# Patient Record
Sex: Female | Born: 1995
Health system: Southern US, Community
[De-identification: ages and names within clinical notes are randomized; demographics above are authoritative.]

## PROBLEM LIST (undated history)

## (undated) ENCOUNTER — Inpatient Hospital Stay (HOSPITAL_COMMUNITY): Payer: Self-pay

## (undated) DIAGNOSIS — Z789 Other specified health status: Secondary | ICD-10-CM

## (undated) DIAGNOSIS — IMO0001 Reserved for inherently not codable concepts without codable children: Secondary | ICD-10-CM

## (undated) HISTORY — DX: Other specified health status: Z78.9

---
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
## 2014-02-16 ENCOUNTER — Emergency Department (HOSPITAL_COMMUNITY)
Admission: EM | Admit: 2014-02-16 | Discharge: 2014-02-16 | Disposition: A | Discharge status: Home / Self Care | Payer: Medicaid Other | Attending: Emergency Medicine | Admitting: Emergency Medicine

## 2014-02-16 ENCOUNTER — Encounter (HOSPITAL_COMMUNITY): Payer: Self-pay | Admitting: Emergency Medicine

## 2014-02-16 DIAGNOSIS — J029 Acute pharyngitis, unspecified: Secondary | ICD-10-CM

## 2014-02-16 DIAGNOSIS — K0889 Other specified disorders of teeth and supporting structures: Secondary | ICD-10-CM

## 2014-02-16 DIAGNOSIS — K08109 Complete loss of teeth, unspecified cause, unspecified class: Secondary | ICD-10-CM | POA: Insufficient documentation

## 2014-02-16 DIAGNOSIS — K089 Disorder of teeth and supporting structures, unspecified: Secondary | ICD-10-CM | POA: Insufficient documentation

## 2014-02-16 LAB — RAPID STREP SCREEN (MED CTR MEBANE ONLY): STREPTOCOCCUS, GROUP A SCREEN (DIRECT): NEGATIVE

## 2014-02-16 MED ORDER — IBUPROFEN 800 MG PO TABS: 800.0000 mg | ORAL_TABLET | Freq: Three times a day (TID) | ORAL | Status: DC

## 2014-02-16 NOTE — ED Provider Notes (Signed)
CSN: [id number]     Arrival date & time 02/16/14  0940 History  This chart was scribed for Phyllis GladHeather Juan Olthoff, PA-C  working with Juliet RudeNathan R. Rubin PayorPickering, MD by Phyllis Lopez, ED scribe. This patient was seen in room TR07C/TR07C and the patient's care was started at 9:56 AM.     First MD Initiated Contact with Patient 02/16/14 [phone]     No chief complaint on file.    (Consider location/radiation/quality/duration/timing/severity/associated sxs/prior Treatment) The history is provided by the patient. The history is limited by a language barrier. A language interpreter was used.   HPI Comments: Phyllis Lopez is a 18 y.o. female who presents to the Emergency Department complaining of sore throat and subjective fever, onset yesterday. Pt also complains of lower left dental pain. She had a dental extraction two days ago. Pt mentions having pain near the site of the extraction.  The pain is gradually worsening. She has pain with swallowing and chewing. Denies sick contact. Pt is taking Hydrocodone for pain with mild relief. She thinks that she may also be taking an antibiotic prescribed by the oral surgeon.  Pt has an dentist appointment 5/16. Denies rhinorrhea.  Denies cough, congestion, difficulty swallowing, or SOB.    A translator was used. Pt's dentist is [facility]ist[facility] Phyllis Lopez.   History reviewed. No pertinent past medical history. Past Surgical History  Procedure Laterality Date  . Eye surgery     No family history on file. History  Substance Use Topics  . Smoking status: Never Smoker   . Smokeless tobacco: Not on file  . Alcohol Use: No   OB History   Grav Para Term Preterm Abortions TAB SAB Ect Mult Living                 Review of Systems  Constitutional: Positive for fever (subjective fever).  HENT: Positive for dental problem and sore throat. Negative for rhinorrhea.   Respiratory: Negative for cough and shortness of breath.       Allergies  Review of patient's allergies indicates  no known allergies.  Home Medications   Prior to Admission medications   Not on File   BP 102/60  Pulse 75  Temp(Src) 98.1 F (36.7 C) (Oral)  Resp 16  SpO2 100%  LMP 02/07/2014 Physical Exam  Nursing note and vitals reviewed. Constitutional: She appears well-developed and well-nourished.  HENT:  Head: Normocephalic and atraumatic.  Right Ear: Tympanic membrane and ear canal normal.  Left Ear: Tympanic membrane and ear canal normal.  Nose: Nose normal.  Mouth/Throat: Uvula is midline and oropharynx is clear and moist. No trismus in the jaw. No dental abscesses or uvula swelling. No oropharyngeal exudate, posterior oropharyngeal edema or posterior oropharyngeal erythema.      Tenderness to palpation of the left lower posterior gingiva.  No drooling.  No difficulty swallowing.  Normal voice phonation.     Neck: Normal range of motion. Neck supple.  Cardiovascular: Normal rate, regular rhythm and normal heart sounds.  Exam reveals no gallop and no friction rub.   No murmur heard. Pulmonary/Chest: Effort normal and breath sounds normal. No respiratory distress. She has no wheezes. She has no rales.  Abdominal: Soft. She exhibits no distension and no mass. There is no tenderness. There is no rebound and no guarding.  Lymphadenopathy:    She has cervical adenopathy (mild).  Neurological: She is alert.  Skin: Skin is warm and dry. No rash noted.  Psychiatric: She has a normal mood and affect.  ED Course  Procedures (including critical care time) DIAGNOSTIC STUDIES: Oxygen Saturation is 100% on room air, normal by my interpretation.    COORDINATION OF CARE:  10:04 AM Discussed course of care with pt which includes Rapid strep screen.  Advised pt to continue taking antibiotics. Pt understands and agrees.   Labs Review Labs Reviewed - No data to display  Imaging Review No results found.   EKG Interpretation None      MDM   Final diagnoses:  None    Patient  presenting with sore throat and dental pain.  Recent dental extraction two days ago.  Rapid strep negative.  No signs of dental infection at the time.  Patient able to swallow without difficulty.  Patient stable for discharge.  Return precautions given.    Phyllis GladHeather Mayley Lish, PA-C 02/19/14 2252

## 2014-02-16 NOTE — ED Notes (Signed)
Norva KarvonenH Laisure, PA, in w/pt and father - communicating via interpretor.

## 2014-02-16 NOTE — ED Notes (Signed)
She states she has had a sore throat since yesterday.

## 2014-02-18 LAB — CULTURE, GROUP A STREP

## 2014-02-21 NOTE — ED Provider Notes (Signed)
Medical screening examination/treatment/procedure(s) were performed by non-physician practitioner and as supervising physician I was immediately available for consultation/collaboration.   EKG Interpretation None       Eydan Chianese R. Leandre Wien, MD 02/21/14 0815 

## 2014-09-23 ENCOUNTER — Ambulatory Visit: Payer: Medicaid Other

## 2014-10-18 NOTE — L&D Delivery Note (Signed)
Delivery Note 19 y.o. G1P0 at 1662w1d who was admitted for IOL secondary to postdates.  At 2:11 AM a viable female was delivered via Vaginal, Spontaneous Delivery (Presentation: Right Occiput Anterior).  APGAR: 9, 9; weight  .   Placenta status: Intact, Spontaneous.  Cord: 3 vessels with the following complications: None.    Anesthesia: Epidural  Episiotomy: None Lacerations: 2nd degree;Perineal Suture Repair: 3.0 vicryl Est. Blood Loss (mL):  300 Mom to postpartum.  Baby to Couplet care / Skin to Skin.  Tameyah Koch A, MD 09/21/2015, 2:21 AM

## 2014-11-18 ENCOUNTER — Encounter: Payer: Self-pay | Admitting: Medical

## 2014-11-18 ENCOUNTER — Ambulatory Visit (INDEPENDENT_AMBULATORY_CARE_PROVIDER_SITE_OTHER): Payer: 59 | Admitting: Medical

## 2014-11-18 VITALS — BP 100/70 | HR 76 | Temp 97.6°F | Resp 16 | Ht 61.2 in | Wt 120.0 lb

## 2014-11-18 DIAGNOSIS — N926 Irregular menstruation, unspecified: Secondary | ICD-10-CM

## 2014-11-18 DIAGNOSIS — J029 Acute pharyngitis, unspecified: Secondary | ICD-10-CM

## 2014-11-18 MED ORDER — MEDROXYPROGESTERONE ACETATE 10 MG PO TABS: ORAL_TABLET | ORAL | Status: DC

## 2014-11-18 NOTE — Progress Notes (Signed)
Subjective: Here as a new patient, accompanied by Phyllis Lopez her friend and soon to be sister in law who helps translate.  Is from [place] originally.  Speak limited English.  Has concern to stop her period temporarily next month.   Her period is set to start 12/07/14.   Her periods are regular, not too heavy, and she is getting married 12/07/2014, the day her period is set to start.  Per her culture, if a woman is menstruating, she has to postpone the wedding for when she is not menstruating to consummate the marriage.   She would like to take a medication to temporarily stop her period.   They have already sent out wedding invitations for the 2 day ceremony.    She otherwise has been in usual state of health, no significant health issues.  No prior sexual activity.  No prior birth control.  No prior medications to delay period.    No hx/o DVT, no hx/o PE, non smoker.   LMP 11/06/14.   No other aggravating or relieving factors.  Gets sore throat 1-2 times per year, feels fine currently.  No hx/o strep, no GERD ,heartburn, eats healthy. No other complaint.  No significant medical history in self or family  ROS as in subjective   Objective: BP 100/70 mmHg  Pulse 76  Temp(Src) 97.6 F (36.4 C) (Oral)  Resp 16  Ht 5' 1.2" (1.554 m)  Wt 120 lb (54.432 kg)  BMI 22.54 kg/m2  LMP 11/06/2014  General appearance: alert, no distress, WD/WN,[place] female HEENT: normocephalic, sclerae anicteric, TMs pearly, nares patent, no discharge or erythema, pharynx normal Oral cavity: MMM, no lesions Neck: supple, no lymphadenopathy, no thyromegaly, no masses Heart: RRR, normal S1, S2, no murmurs Lungs: CTA bilaterally, no wheezes, rhonchi, or rales Abdomen: +bs, soft, non tender, non distended, no masses, no hepatomegaly, no splenomegaly Pulses: 2+ symmetric, upper and lower extremities, normal cap refill Gyn: not examined, patient declines and no prior sexual activity   Assessment: Encounter Diagnosis  Name  Primary?  . Menstrual changes Yes     Plan: She appears to be a healthy [place] female.   We discussed her desire to postpone her period for her wedding coming up on 11/2014 the day her period should be starting.  discussed possible medications that could help this.  Advised there are no guarantees 100% that medications will work.   Gave options of hormonal medications.   Discussed risks of the medications, discussed the fact that these medications are hormones and used normally for contraception even though that is not her intended purpose.  Provera sent to pharmacy as below.  Advised she return for a physical at her convenience.

## 2015-03-03 ENCOUNTER — Other Ambulatory Visit (HOSPITAL_COMMUNITY): Payer: Self-pay | Admitting: Urology

## 2015-03-03 DIAGNOSIS — Z3682 Encounter for antenatal screening for nuchal translucency: Secondary | ICD-10-CM

## 2015-03-03 LAB — OB RESULTS CONSOLE RUBELLA ANTIBODY, IGM: RUBELLA: IMMUNE

## 2015-03-03 LAB — OB RESULTS CONSOLE GC/CHLAMYDIA
CHLAMYDIA, DNA PROBE: NEGATIVE
Gonorrhea: NEGATIVE

## 2015-03-03 LAB — OB RESULTS CONSOLE VARICELLA ZOSTER ANTIBODY, IGG: Varicella: IMMUNE

## 2015-03-03 LAB — OB RESULTS CONSOLE RPR: RPR: NONREACTIVE

## 2015-03-03 LAB — OB RESULTS CONSOLE ANTIBODY SCREEN: ANTIBODY SCREEN: NEGATIVE

## 2015-03-03 LAB — OB RESULTS CONSOLE ABO/RH: RH TYPE: POSITIVE

## 2015-03-03 LAB — OB RESULTS CONSOLE HIV ANTIBODY (ROUTINE TESTING): HIV: NONREACTIVE

## 2015-03-03 LAB — OB RESULTS CONSOLE HEPATITIS B SURFACE ANTIGEN: Hepatitis B Surface Ag: NEGATIVE

## 2015-03-07 ENCOUNTER — Encounter (HOSPITAL_COMMUNITY): Payer: Self-pay

## 2015-03-07 ENCOUNTER — Ambulatory Visit (HOSPITAL_COMMUNITY)
Admission: RE | Admit: 2015-03-07 | Discharge: 2015-03-07 | Disposition: A | Discharge status: Home / Self Care | Payer: Medicaid Other | Source: Ambulatory Visit | Attending: Pediatrics | Admitting: Pediatrics

## 2015-03-07 DIAGNOSIS — Z3A12 12 weeks gestation of pregnancy: Secondary | ICD-10-CM | POA: Insufficient documentation

## 2015-03-07 DIAGNOSIS — Z3682 Encounter for antenatal screening for nuchal translucency: Secondary | ICD-10-CM

## 2015-03-07 DIAGNOSIS — Z36 Encounter for antenatal screening of mother: Secondary | ICD-10-CM | POA: Insufficient documentation

## 2015-03-12 ENCOUNTER — Other Ambulatory Visit (HOSPITAL_COMMUNITY): Payer: Self-pay | Admitting: Urology

## 2015-04-22 ENCOUNTER — Ambulatory Visit (HOSPITAL_COMMUNITY)
Admission: RE | Admit: 2015-04-22 | Discharge: 2015-04-22 | Disposition: A | Discharge status: Home / Self Care | Payer: Medicaid Other | Source: Ambulatory Visit | Attending: Physician Assistant | Admitting: Physician Assistant

## 2015-04-22 ENCOUNTER — Other Ambulatory Visit (HOSPITAL_COMMUNITY): Payer: Self-pay | Admitting: Urology

## 2015-04-22 DIAGNOSIS — Z0489 Encounter for examination and observation for other specified reasons: Secondary | ICD-10-CM

## 2015-04-22 DIAGNOSIS — IMO0002 Reserved for concepts with insufficient information to code with codable children: Secondary | ICD-10-CM

## 2015-04-22 DIAGNOSIS — Z3A19 19 weeks gestation of pregnancy: Secondary | ICD-10-CM | POA: Insufficient documentation

## 2015-04-22 DIAGNOSIS — Z3689 Encounter for other specified antenatal screening: Secondary | ICD-10-CM | POA: Insufficient documentation

## 2015-04-22 DIAGNOSIS — Z36 Encounter for antenatal screening of mother: Secondary | ICD-10-CM | POA: Insufficient documentation

## 2015-06-06 ENCOUNTER — Inpatient Hospital Stay (HOSPITAL_COMMUNITY)
Admission: AD | Admit: 2015-06-06 | Discharge: 2015-06-06 | Disposition: A | Discharge status: Home / Self Care | Payer: Medicaid Other | Source: Ambulatory Visit | Attending: Family Medicine | Admitting: Family Medicine

## 2015-06-06 ENCOUNTER — Encounter (HOSPITAL_COMMUNITY): Payer: Self-pay | Admitting: *Deleted

## 2015-06-06 DIAGNOSIS — R05 Cough: Secondary | ICD-10-CM | POA: Insufficient documentation

## 2015-06-06 DIAGNOSIS — Z758 Other problems related to medical facilities and other health care: Secondary | ICD-10-CM

## 2015-06-06 DIAGNOSIS — Z3A25 25 weeks gestation of pregnancy: Secondary | ICD-10-CM | POA: Diagnosis not present

## 2015-06-06 DIAGNOSIS — R079 Chest pain, unspecified: Secondary | ICD-10-CM | POA: Diagnosis not present

## 2015-06-06 DIAGNOSIS — Z789 Other specified health status: Secondary | ICD-10-CM

## 2015-06-06 DIAGNOSIS — J029 Acute pharyngitis, unspecified: Secondary | ICD-10-CM | POA: Insufficient documentation

## 2015-06-06 DIAGNOSIS — J069 Acute upper respiratory infection, unspecified: Secondary | ICD-10-CM

## 2015-06-06 DIAGNOSIS — O9989 Other specified diseases and conditions complicating pregnancy, childbirth and the puerperium: Secondary | ICD-10-CM | POA: Diagnosis not present

## 2015-06-06 DIAGNOSIS — B9789 Other viral agents as the cause of diseases classified elsewhere: Secondary | ICD-10-CM

## 2015-06-06 HISTORY — DX: Reserved for inherently not codable concepts without codable children: IMO0001

## 2015-06-06 NOTE — Discharge Instructions (Signed)
FOR YOUR COUGH:  -  Robitussin DM (with guaifenesin and dextromethorphan)-- SHOW this to the pharmacist if you are unsure of what to buy.  -  Drink lots of water - Try drinking tea with honey and ginger -  Get lots of sleep  Return to care if you have - High fevers >101.5 - Cough that is productive with green or brown sputum - Nausea/vomitting - trouble breathing  - Any worsening symptoms  You can call the [facility] Department for an appointment if you are feeling worse.   Upper Respiratory Infection, Adult An upper respiratory infection (URI) is also sometimes known as the common cold. The upper respiratory tract includes the nose, sinuses, throat, trachea, and bronchi. Bronchi are the airways leading to the lungs. Most people improve within 1 week, but symptoms can last up to 2 weeks. A residual cough may last even longer.  CAUSES Many different viruses can infect the tissues lining the upper respiratory tract. The tissues become irritated and inflamed and often become very moist. Mucus production is also common. A cold is contagious. You can easily spread the virus to others by oral contact. This includes kissing, sharing a glass, coughing, or sneezing. Touching your mouth or nose and then touching a surface, which is then touched by another person, can also spread the virus.  Cough, Adult  A cough is a reflex. It helps you clear your throat and airways. A cough can help heal your body. A cough can last 2 or 3 weeks (acute) or may last more than 8 weeks (chronic). Some common causes of a cough can include an infection, allergy, or a cold. HOME CARE  Only take medicine as told by your doctor.  If given, take your medicines (antibiotics) as told. Finish them even if you start to feel better.  Use a cold steam vaporizer or humidifier in your home. This can help loosen thick spit (secretions).  Sleep so you are almost sitting up (semi-upright). Use pillows to do this. This  helps reduce coughing.  Rest as needed.  Stop smoking if you smoke. GET HELP RIGHT AWAY IF:  You have yellowish-white fluid (pus) in your thick spit.  Your cough gets worse.  Your medicine does not reduce coughing, and you are losing sleep.  You cough up blood.  You have trouble breathing.  Your pain gets worse and medicine does not help.  You have a fever. MAKE SURE YOU:   Understand these instructions.  Will watch your condition.  Will get help right away if you are not doing well or get worse. Document Released: 06/17/2011 Document Revised: 02/18/2014 Document Reviewed: 06/17/2011 [facility] Patient Information 2015 [place], [place]. This information is not intended to replace advice given to you by your health care provider. Make sure you discuss any questions you have with your health care provider.  SYMPTOMS  Symptoms typically develop 1 to 3 days after you come in contact with a cold virus. Symptoms vary from person to person. They may include:  Runny nose.  Sneezing.  Nasal congestion.  Sinus irritation.  Sore throat.  Loss of voice (laryngitis).  Cough.  Fatigue.  Muscle aches.  Loss of appetite.  Headache.  Low-grade fever. DIAGNOSIS  You might diagnose your own cold based on familiar symptoms, since most people get a cold 2 to 3 times a year. Your caregiver can confirm this based on your exam. Most importantly, your caregiver can check that your symptoms are not due to another  disease such as strep throat, sinusitis, pneumonia, asthma, or epiglottitis. Blood tests, throat tests, and X-rays are not necessary to diagnose a common cold, but they may sometimes be helpful in excluding other more serious diseases. Your caregiver will decide if any further tests are required. RISKS AND COMPLICATIONS  You may be at risk for a more severe case of the common cold if you smoke cigarettes, have chronic heart disease (such as heart failure) or lung disease (such  as asthma), or if you have a weakened immune system. The very young and very old are also at risk for more serious infections. Bacterial sinusitis, middle ear infections, and bacterial pneumonia can complicate the common cold. The common cold can worsen asthma and chronic obstructive pulmonary disease (COPD). Sometimes, these complications can require emergency medical care and may be life-threatening. PREVENTION  The best way to protect against getting a cold is to practice good hygiene. Avoid oral or hand contact with people with cold symptoms. Wash your hands often if contact occurs. There is no clear evidence that vitamin C, vitamin E, echinacea, or exercise reduces the chance of developing a cold. However, it is always recommended to get plenty of rest and practice good nutrition. TREATMENT  Treatment is directed at relieving symptoms. There is no cure. Antibiotics are not effective, because the infection is caused by a virus, not by bacteria. Treatment may include:  Increased fluid intake. Sports drinks offer valuable electrolytes, sugars, and fluids.  Breathing heated mist or steam (vaporizer or shower).  Eating chicken soup or other clear broths, and maintaining good nutrition.  Getting plenty of rest.  Using gargles or lozenges for comfort.  Controlling fevers with ibuprofen or acetaminophen as directed by your caregiver.  Increasing usage of your inhaler if you have asthma. Zinc gel and zinc lozenges, taken in the first 24 hours of the common cold, can shorten the duration and lessen the severity of symptoms. Pain medicines may help with fever, muscle aches, and throat pain. A variety of non-prescription medicines are available to treat congestion and runny nose. Your caregiver can make recommendations and may suggest nasal or lung inhalers for other symptoms.  HOME CARE INSTRUCTIONS   Only take over-the-counter or prescription medicines for pain, discomfort, or fever as directed by  your caregiver.  Use a warm mist humidifier or inhale steam from a shower to increase air moisture. This may keep secretions moist and make it easier to breathe.  Drink enough water and fluids to keep your urine clear or pale yellow.  Rest as needed.  Return to work when your temperature has returned to normal or as your caregiver advises. You may need to stay home longer to avoid infecting others. You can also use a face mask and careful hand washing to prevent spread of the virus. SEEK MEDICAL CARE IF:   After the first few days, you feel you are getting worse rather than better.  You need your caregiver's advice about medicines to control symptoms.  You develop chills, worsening shortness of breath, or brown or red sputum. These may be signs of pneumonia.  You develop yellow or brown nasal discharge or pain in the face, especially when you bend forward. These may be signs of sinusitis.  You develop a fever, swollen neck glands, pain with swallowing, or white areas in the back of your throat. These may be signs of strep throat. SEEK IMMEDIATE MEDICAL CARE IF:   You have a fever.  You develop severe or persistent  headache, ear pain, sinus pain, or chest pain.  You develop wheezing, a prolonged cough, cough up blood, or have a change in your usual mucus (if you have chronic lung disease).  You develop sore muscles or a stiff neck. Document Released: 03/30/2001 Document Revised: 12/27/2011 Document Reviewed: 01/09/2014 [facility] Patient Information 2015 [place], [place]. This information is not intended to replace advice given to you by your health care provider. Make sure you discuss any questions you have with your health care provider.

## 2015-06-06 NOTE — MAU Note (Signed)
Patient presents at [redacted] weeks gestation with c/o chest pain, cough, and sore throat since yesterday. States the fetus has been active but not today. Denies bleeding or discharge.

## 2015-06-06 NOTE — MAU Provider Note (Signed)
History     CSN: [id number]  Arrival date and time: 06/06/15 1335  Chief Complaint  Patient presents with  . Chest Pain  . Cough  . Sore Throat   HPI Patient is 19 y.o. G1P0 at [redacted]w[redacted]d here with complaints of sore throat, cough, and chest pain  Sore throat and chest pain. Reports cough. The throat pain is constant and chest pain is when she is coughing.  CP is located in the middle of her chest. Rates throat pain as 10/10 with coughing and 4-5/10 when not coughing.  Rates chest pain as 10/10 with coughing and is 0/10 without coughing.   Coughing aggravates sore throat and chest pain. Coughing is worse with lying back. Reports sneezing. No fevers, chills. Reports SOB and trouble breathing when she is coughing. The coughing is worsening.  Has not tried medications. Used to take a OTC for throat pain prior to pregnancy.    Travel- Denies recent travel in [place] or abroad Sick contacts- denies  Exposure to young children- denies Denies nasal congestion and itchy or watery eyes.  Denies smoking  +FM, denies LOF, VB, contractions, vaginal discharge.  OB History    Gravida Para Term Preterm AB TAB SAB Ectopic Multiple Living   1 0        0      Past Medical History  Diagnosis Date  . Medical history non-contributory     Past Surgical History  Procedure Laterality Date  . Eye surgery      History reviewed. No pertinent family history.  Social History  Substance Use Topics  . Smoking status: Never Smoker   . Smokeless tobacco: None  . Alcohol Use: No    Allergies: No Known Allergies  Prescriptions prior to admission  Medication Sig Dispense Refill Last Dose  . Prenatal Vit-Fe Fumarate-FA (PRENATAL VITAMIN PO) Take 1 tablet by mouth daily.    06/06/2015 at Unknown time  . medroxyPROGESTERone (PROVERA) 10 MG tablet Take 1 tablet daily at same exact time every day.  Begin 7 days prior to onset of menses, and don't stop until about 3-4 days after wedding (Patient not taking:  Reported on 03/07/2015) 20 tablet 0 Not Taking    Review of Systems  Constitutional: Negative for fever and chills.  HENT: Positive for sore throat. Negative for congestion.   Eyes: Negative for blurred vision and double vision.  Respiratory: Positive for cough. Negative for shortness of breath.   Cardiovascular: Negative for chest pain and orthopnea.  Gastrointestinal: Negative for nausea and vomiting.  Genitourinary: Negative for frequency.  Musculoskeletal: Negative for myalgias.  Skin: Negative for rash.  Neurological: Negative for dizziness, tingling, weakness and headaches.  Endo/Heme/Allergies: Does not bruise/bleed easily.  Psychiatric/Behavioral: Negative for depression.     Physical Exam   Blood pressure 121/65, pulse 99, temperature 98.2 F (36.8 C), temperature source Oral, resp. rate 20, height  (1.575 m), weight 133 lb (60.328 kg), last menstrual period 12/07/2014.  Physical Exam  Constitutional: She is oriented to person, place, and time. She appears well-developed and well-nourished. No distress.  Pregnant female  HENT:  Head: Normocephalic and atraumatic.  Mouth/Throat: Oropharynx is clear and moist. No oropharyngeal exudate.  Mildly erythematous posterior oral pharynx.   Eyes: Conjunctivae are normal. No scleral icterus.  Neck: Normal range of motion. Neck supple.  Cardiovascular: Normal rate and intact distal pulses.   Respiratory: Effort normal. No respiratory distress. She has no wheezes. She has no rales. She exhibits no tenderness.  Normal WOB, All lung fields are clear to ascultation  GI: Soft. There is no tenderness.  Gravid  Genitourinary: Vagina normal.  Musculoskeletal: Normal range of motion. She exhibits no edema.  Lymphadenopathy:    She has cervical adenopathy (shotty LAD in bilateral cervical chain and submandidular).  Neurological: She is alert and oriented to person, place, and time.  Skin: Skin is warm and dry. No rash noted.   Psychiatric: She has a normal mood and affect.    MAU Course  Procedures MDM No need for labs given presentation No need for radiological studies as patient has no focal findings.  NST 135/mod/+accels (10X10) no decels Toco: quiet  Assessment and Plan  Margot Oriordan is is 19 y.o. G1P0 at [redacted]w[redacted]d presenting with cough, sore throat and chest pain likely 2/2 to viral URI given onset of sx, time course and lack of physical exam finding. Unlikely bacterial PNA given no fevers, rigors,focal findings on lung exam. Unlikely strep pharyngitis with Centor criteria of 0.  - Recommended conservative management with OTC cough suppressants  - Discussed use of tea with ginger and honey for cough - Recommended hydration - Reviewed return precautions for PNA  Isa Rankin Kelaiah Escalona 06/06/2015, 3:32 PM

## 2015-08-20 LAB — OB RESULTS CONSOLE GC/CHLAMYDIA
Chlamydia: NEGATIVE
GC PROBE AMP, GENITAL: NEGATIVE

## 2015-08-20 LAB — OB RESULTS CONSOLE GBS: STREP GROUP B AG: NEGATIVE

## 2015-09-16 ENCOUNTER — Telehealth (HOSPITAL_COMMUNITY): Payer: Self-pay | Admitting: *Deleted

## 2015-09-16 NOTE — Telephone Encounter (Signed)
Preadmission screen  

## 2015-09-16 NOTE — Telephone Encounter (Signed)
Interpreter number [phone]

## 2015-09-20 ENCOUNTER — Encounter (HOSPITAL_COMMUNITY): Payer: Self-pay

## 2015-09-20 ENCOUNTER — Inpatient Hospital Stay (HOSPITAL_COMMUNITY)
Admission: RE | Admit: 2015-09-20 | Discharge: 2015-09-22 | DRG: 775 | Disposition: A | Discharge status: Home / Self Care | Payer: Medicaid Other | Source: Ambulatory Visit | Attending: Obstetrics & Gynecology | Admitting: Obstetrics & Gynecology

## 2015-09-20 ENCOUNTER — Inpatient Hospital Stay (HOSPITAL_COMMUNITY): Payer: Medicaid Other | Admitting: Anesthesiology

## 2015-09-20 VITALS — BP 105/50 | HR 78 | Temp 98.5°F | Resp 18 | Ht 62.0 in | Wt 154.3 lb

## 2015-09-20 DIAGNOSIS — Z3A41 41 weeks gestation of pregnancy: Secondary | ICD-10-CM

## 2015-09-20 DIAGNOSIS — Z8249 Family history of ischemic heart disease and other diseases of the circulatory system: Secondary | ICD-10-CM | POA: Diagnosis not present

## 2015-09-20 DIAGNOSIS — O480 Post-term pregnancy: Principal | ICD-10-CM | POA: Diagnosis not present

## 2015-09-20 DIAGNOSIS — Z789 Other specified health status: Secondary | ICD-10-CM | POA: Diagnosis present

## 2015-09-20 DIAGNOSIS — Z3401 Encounter for supervision of normal first pregnancy, first trimester: Secondary | ICD-10-CM

## 2015-09-20 DIAGNOSIS — Z833 Family history of diabetes mellitus: Secondary | ICD-10-CM

## 2015-09-20 DIAGNOSIS — IMO0001 Reserved for inherently not codable concepts without codable children: Secondary | ICD-10-CM

## 2015-09-20 LAB — CBC
HCT: 36.4 % (ref 36.0–46.0)
Hemoglobin: 12.2 g/dL (ref 12.0–15.0)
MCH: 28.9 pg (ref 26.0–34.0)
MCHC: 33.5 g/dL (ref 30.0–36.0)
MCV: 86.3 fL (ref 78.0–100.0)
PLATELETS: 153 10*3/uL (ref 150–400)
RBC: 4.22 MIL/uL (ref 3.87–5.11)
RDW: 15 % (ref 11.5–15.5)
WBC: 14.7 10*3/uL — AB (ref 4.0–10.5)

## 2015-09-20 LAB — TYPE AND SCREEN
ABO/RH(D): AB POS
Antibody Screen: NEGATIVE

## 2015-09-20 LAB — ABO/RH: ABO/RH(D): AB POS

## 2015-09-20 MED ORDER — MISOPROSTOL 25 MCG QUARTER TABLET
25.0000 ug | ORAL_TABLET | ORAL | Status: DC | PRN
  Administered 2015-09-20: 25 ug via VAGINAL
  Filled 2015-09-20: qty 0.25

## 2015-09-20 MED ORDER — FENTANYL CITRATE (PF) 100 MCG/2ML IJ SOLN: 100.0000 ug | INTRAMUSCULAR | Status: DC | PRN

## 2015-09-20 MED ORDER — OXYTOCIN 40 UNITS IN LACTATED RINGERS INFUSION - SIMPLE MED
1.0000 m[IU]/min | INTRAVENOUS | Status: DC
  Administered 2015-09-20: 1 m[IU]/min via INTRAVENOUS

## 2015-09-20 MED ORDER — TERBUTALINE SULFATE 1 MG/ML IJ SOLN
INTRAMUSCULAR | Status: AC
  Administered 2015-09-20: 0.25 mg via SUBCUTANEOUS
  Filled 2015-09-20: qty 1

## 2015-09-20 MED ORDER — LACTATED RINGERS IV SOLN
INTRAVENOUS | Status: DC
  Administered 2015-09-20 (×3): via INTRAVENOUS

## 2015-09-20 MED ORDER — ONDANSETRON HCL 4 MG/2ML IJ SOLN: 4.0000 mg | Freq: Four times a day (QID) | INTRAMUSCULAR | Status: DC | PRN

## 2015-09-20 MED ORDER — FENTANYL 2.5 MCG/ML BUPIVACAINE 1/10 % EPIDURAL INFUSION (WH - ANES)
14.0000 mL/h | INTRAMUSCULAR | Status: DC | PRN
  Administered 2015-09-20 (×2): 14 mL/h via EPIDURAL
  Filled 2015-09-20: qty 125

## 2015-09-20 MED ORDER — EPHEDRINE 5 MG/ML INJ
10.0000 mg | INTRAVENOUS | Status: DC | PRN
  Filled 2015-09-20: qty 2

## 2015-09-20 MED ORDER — DIPHENHYDRAMINE HCL 50 MG/ML IJ SOLN: 12.5000 mg | INTRAMUSCULAR | Status: DC | PRN

## 2015-09-20 MED ORDER — LIDOCAINE HCL (PF) 1 % IJ SOLN
30.0000 mL | INTRAMUSCULAR | Status: DC | PRN
  Filled 2015-09-20: qty 30

## 2015-09-20 MED ORDER — LACTATED RINGERS IV SOLN
500.0000 mL | INTRAVENOUS | Status: DC | PRN
  Administered 2015-09-20: 500 mL via INTRAVENOUS

## 2015-09-20 MED ORDER — OXYTOCIN 40 UNITS IN LACTATED RINGERS INFUSION - SIMPLE MED
1.0000 m[IU]/min | INTRAVENOUS | Status: DC
  Administered 2015-09-20: 2 m[IU]/min via INTRAVENOUS
  Filled 2015-09-20: qty 1000

## 2015-09-20 MED ORDER — OXYCODONE-ACETAMINOPHEN 5-325 MG PO TABS: 1.0000 | ORAL_TABLET | ORAL | Status: DC | PRN

## 2015-09-20 MED ORDER — OXYTOCIN 40 UNITS IN LACTATED RINGERS INFUSION - SIMPLE MED: 62.5000 mL/h | INTRAVENOUS | Status: DC

## 2015-09-20 MED ORDER — OXYTOCIN BOLUS FROM INFUSION: 500.0000 mL | INTRAVENOUS | Status: DC

## 2015-09-20 MED ORDER — CITRIC ACID-SODIUM CITRATE 334-500 MG/5ML PO SOLN
30.0000 mL | ORAL | Status: DC | PRN
  Filled 2015-09-20: qty 15

## 2015-09-20 MED ORDER — TERBUTALINE SULFATE 1 MG/ML IJ SOLN
0.2500 mg | Freq: Once | INTRAMUSCULAR | Status: AC | PRN
  Administered 2015-09-20: 0.25 mg via SUBCUTANEOUS

## 2015-09-20 MED ORDER — OXYCODONE-ACETAMINOPHEN 5-325 MG PO TABS: 2.0000 | ORAL_TABLET | ORAL | Status: DC | PRN

## 2015-09-20 MED ORDER — PHENYLEPHRINE 40 MCG/ML (10ML) SYRINGE FOR IV PUSH (FOR BLOOD PRESSURE SUPPORT)
80.0000 ug | PREFILLED_SYRINGE | INTRAVENOUS | Status: DC | PRN
  Filled 2015-09-20: qty 2
  Filled 2015-09-20: qty 20

## 2015-09-20 MED ORDER — ACETAMINOPHEN 325 MG PO TABS: 650.0000 mg | ORAL_TABLET | ORAL | Status: DC | PRN

## 2015-09-20 MED ORDER — LIDOCAINE HCL (PF) 1 % IJ SOLN
INTRAMUSCULAR | Status: DC | PRN
  Administered 2015-09-20: 5 mL
  Administered 2015-09-20: 3 mL
  Administered 2015-09-20: 5 mL

## 2015-09-20 NOTE — Progress Notes (Signed)
Labor Progress Note Eduard RouxBhadri Lopez is a 19 y.o. G1P0 at 3465w0d presented for IOL for postdates  S: patient with prolonged deceleration but now stable. Pitocin was restarted and fetus tolerating well.   O:  BP 121/79 mmHg  Pulse 92  Temp(Src) 98.1 F (36.7 C) (Oral)  Resp 18  Ht 5\' 2"  (1.575 m)  Wt 154 lb 5.2 oz (70 kg)  BMI 28.22 kg/m2  SpO2 94%  LMP 12/07/2014 EFM: 140/mod/+accels, no further decels with excellent beat to beat variability  CVE: Dilation: 6.5 Effacement (%): 70 Station: Ballotable Presentation: Vertex Exam by:: Dr. Alvester MorinNewton   A&P: 19 y.o. G1P0 8865w0d her for PD IOL #Labor: progressing normally. Continue Pitocin, currently at 2mu with regular contractions #Pain: prn epidural or IV meds #FWB: Cat I #GBS neg #Anticipate NSVD  Federico FlakeKimberly Niles Adekunle Rohrbach, MD 6:25 PM

## 2015-09-20 NOTE — H&P (Signed)
OBSTETRIC ADMISSION HISTORY AND PHYSICAL  Phyllis RouxBhadri Lopez is a 19 y.o. female G1P0 with IUP at 4732w0d by L/12  presenting for IOL for postdates. SHe has been having contractions. Reports she was 1cm in clinic She reports +FMs, No LOF, no VB, no blurry vision, headaches or peripheral edema, and RUQ pain.  She plans on bottle feeding. She request Depo for birth control.  Dating: By L/12 --->  Estimated Date of Delivery: 09/13/15  Sono:  @12wk - NT wnl  @[redacted]w[redacted]d   normal anatomy, female  Prenatal History/Complications:  Past Medical History: Past Medical History  Diagnosis Date  . Healthy adult     Past Surgical History: Past Surgical History  Procedure Laterality Date  . Eye surgery  2011    lazy eye    Obstetrical History: OB History    Gravida Para Term Preterm AB TAB SAB Ectopic Multiple Living   1 0        0      Social History: Social History   Social History  . Marital Status: Single    Spouse Name: N/A  . Number of Children: N/A  . Years of Education: N/A   Social History Main Topics  . Smoking status: Never Smoker   . Smokeless tobacco: None  . Alcohol Use: No  . Drug Use: No  . Sexual Activity: Yes    Birth Control/ Protection: None   Other Topics Concern  . None   Social History Narrative    Family History: Family History  Problem Relation Age of Onset  . Diabetes Neg Hx   . Hypertension Neg Hx     Allergies: No Known Allergies  Prescriptions prior to admission  Medication Sig Dispense Refill Last Dose  . Prenatal Vit-Fe Fumarate-FA (PRENATAL VITAMIN PO) Take 1 tablet by mouth daily.    06/06/2015 at Unknown time     Review of Systems   All systems reviewed and negative except as stated in HPI  Blood pressure 115/75, pulse 99, temperature 97.8 F (36.6 C), temperature source Oral, resp. rate 18, height 5\' 2"  (1.575 m), weight 154 lb 5.2 oz (70 kg), last menstrual period 12/07/2014. General appearance: alert, cooperative and appears stated  age Lungs: clear to auscultation bilaterally Heart: regular rate and rhythm Abdomen: soft, non-tender; bowel sounds normal Pelvic: adequate Extremities: Homans sign is negative, no sign of DVT  Presentation: cephalic Fetal monitoringBaseline: 145 bpm, Variability: Good {> 6 bpm), Accelerations: Reactive and Decelerations: Absent Uterine activityFrequency: Every 7-10 minutes Dilation: 2.5 Effacement (%): 50 Exam by:: Dr. Alvester MorinNewton   Prenatal labs: ABO, Rh: AB/Positive/-- (05/16 0000) Antibody: Negative (05/16 0000) Rubella: Immune RPR: Nonreactive (05/16 0000)  HBsAg: Negative (05/16 0000)  HIV: Non-reactive (05/16 0000)  GBS: Negative (11/02 0000)  1 hr Glucola 130, neg Genetic screening  NT negative, Quad neg Anatomy US wnl  Prenatal Transfer Tool  Maternal Diabetes: No Genetic Screening: Normal- NT was wnl  Maternal Ultrasounds/Referrals: Normal Fetal Ultrasounds or other Referrals:  None Maternal Substance Abuse:  No Significant Maternal Medications:  None Significant Maternal Lab Results: Lab values include: Group B Strep negative  Results for orders placed or performed during the hospital encounter of 09/20/15 (from the past 24 hour(s))  CBC   Collection Time: 09/20/15  8:10 AM  Result Value Ref Range   WBC 14.7 (H) 4.0 - 10.5 K/uL   RBC 4.22 3.87 - 5.11 MIL/uL   Hemoglobin 12.2 12.0 - 15.0 g/dL   HCT 16.136.4 09.636.0 - 04.546.0 %  MCV 86.3 78.0 - 100.0 fL   MCH 28.9 26.0 - 34.0 pg   MCHC 33.5 30.0 - 36.0 g/dL   RDW 16.1 09.6 - 04.5 %   Platelets 153 150 - 400 K/uL    Patient Active Problem List   Diagnosis Date Noted  . Post term pregnancy, 41 weeks 09/20/2015  . Supervision of normal first pregnancy 06/06/2015   Assessment: Phyllis Lopez is a 19 y.o. G1P0 at [redacted]w[redacted]d here for PD- IOL  #Labor:Induction with cytotec for further ripening, unlikely that patient will need FB.Plan for pitocin when appropriate #Pain: She will think about an epidural #FWB: Cat I #ID:  GBS  neg #MOF: Bottle/formula #MOC: Depo #Circ:  Declines  Federico Flake 09/20/2015, 8:57 AM

## 2015-09-20 NOTE — Progress Notes (Addendum)
[employer] Interpreter Service used to [place] for pt, Charity fundraiserN and MD.

## 2015-09-20 NOTE — Progress Notes (Addendum)
Warden[employer] Interpreter Service used by phone to [employer] for [employer] .

## 2015-09-20 NOTE — Progress Notes (Signed)
LABOR PROGRESS NOTE  Phyllis Lopez is a 19 y.o. G1P0 at 6258w0d  admitted for pdiol.  Subjective: Moderate pain w/ ctxnsw  Objective: BP 107/50 mmHg  Pulse 92  Temp(Src) 98.4 F (36.9 C) (Oral)  Resp 20  Ht 5\' 2"  (1.575 m)  Wt 154 lb 5.2 oz (70 kg)  BMI 28.22 kg/m2  SpO2 94%  LMP 12/07/2014 or  Filed Vitals:   09/20/15 1932 09/20/15 2003 09/20/15 2030 09/20/15 2100  BP: 119/67 97/58 95/50  107/50  Pulse: 86 90 86 92  Temp: 98 F (36.7 C)  98.4 F (36.9 C)   TempSrc: Oral  Oral   Resp: 18 20 20 20   Height:      Weight:      SpO2:        140/mod/+a/-d ctxns q3 min  Dilation: 5.5 Effacement (%): 70 Station: -2 Presentation: Vertex Exam by:: Dr. Ashok PallWouk  Labs: Lab Results  Component Value Date   WBC 14.7* 09/20/2015   HGB 12.2 09/20/2015   HCT 36.4 09/20/2015   MCV 86.3 09/20/2015   PLT 153 09/20/2015    Patient Active Problem List   Diagnosis Date Noted  . Post term pregnancy, 41 weeks 09/20/2015  . Language barrier, speaks Nepali only 06/06/2015    Assessment / Plan: 19 y.o. G1P0 at 558w0d here for PDIOL  Labor: up-titrate pitocin (currently at 3); eventual arom Fetal Wellbeing:  Cat 1 (prolonged decel earlier today) Pain Control:  fentanyl Anticipated MOD:  vaginal  [name], MD 09/20/2015, 9:31 PM

## 2015-09-20 NOTE — Progress Notes (Signed)
Faculty Practice OB/GYN Attending Note  Subjective:  Called to evaluate patient with ongoing FHR deceleration to a nadir of 90s.  Neonatal intrauterine resuscitation maneuvers were already ongoing with positional changes, oxygen by facial mask. Pitocin was at 2 mu/min and has been discontinued.  Patient was checked and found to be 4/70/ballotable. Terbutaline 0.25 mg [place] x 1 was immediately ordered and was given to patient. Communication with patient and her family is being done with the help of a phone Nepali interpreter.  Admitted on 09/20/2015 for Post term pregnancy, 41 weeks.   Objective:  Blood pressure 137/76, pulse 86, temperature 97.8 F (36.6 C), temperature source Oral, resp. rate 20, height 5\' 2"  (1.575 m), weight 154 lb 5.2 oz (70 kg), last menstrual period 12/07/2014, SpO2 100 %. FHT  Baseline 130 bpm, moderate variability, prolonged 9 minute deceleration to a nadir of 90s, then returned after terbutaline administration to a baseline of 140-150s with moderate variability, + accelerations, no decelerations Toco: tetanic every minute contractions spaced out after terbutaline administration Gen: NAD HENT: Normocephalic, atraumatic Lungs: Normal respiratory effort Heart: Regular rate noted Abdomen: NT, gravid fundus, soft after terbutaline administration Cervix: 4/70/ballotable by Dr. Alvester MorinNewton Ext: 2+ DTRs, no edema, no cyanosis, negative Homan's sign  Assessment & Plan:  19 y.o. G1P0 at 4853w0d admitted for induction of labor for postdates, now with prolonged deceleration that resolved after multiple maneuvers and terbutaline administration - Will continue to keep pitocin off for now, restart at 1 mu/min in about 30 minutes - Patient and her family were informed of the concerns with this deceleration, they were told that if this recurs and patient is still remote from vaginal delivery, cesarean delivery would be indicated.  The risks of cesarean section discussed with the patient and  family with the help of the Nepali interpreter included but were not limited to: bleeding which may require transfusion or reoperation; infection which may require antibiotics; injury to bowel, bladder, ureters or other surrounding organs; injury to the fetus; need for additional procedures including hysterectomy in the event of a life-threatening hemorrhage; placental abnormalities wth subsequent pregnancies, incisional problems, thromboembolic phenomenon and other postoperative/anesthesia complications. Written consent for the procedure was obtained; will place consent in chart. - Category I FHR tracing for now, will continue close observation - Hopeful for vaginal delivery   Jaynie CollinsUGONNA  Nazeer Romney, MD, FACOG Attending Obstetrician & Gynecologist Faculty Practice, [facility] [place]

## 2015-09-20 NOTE — Anesthesia Procedure Notes (Signed)
Epidural Patient location during procedure: OB  Staffing Anesthesiologist: Darin Arndt Performed by: anesthesiologist   Preanesthetic Checklist Completed: patient identified, site marked, surgical consent, pre-op evaluation, timeout performed, IV checked, risks and benefits discussed and monitors and equipment checked  Epidural Patient position: sitting Prep: DuraPrep Patient monitoring: heart rate, continuous pulse ox and blood pressure Approach: right paramedian Location: L3-L4 Injection technique: LOR saline  Needle:  Needle type: Tuohy  Needle gauge: 17 G Needle length: 9 cm and 9 Needle insertion depth: 5 cm Catheter type: closed end flexible Catheter size: 20 Guage Catheter at skin depth: 10 cm Test dose: negative  Assessment Events: blood not aspirated, injection not painful, no injection resistance, negative IV test and no paresthesia  Additional Notes Patient identified. Risks/Benefits/Options discussed with patient including but not limited to bleeding, infection, nerve damage, paralysis, failed block, incomplete pain control, headache, blood pressure changes, nausea, vomiting, reactions to medication both or allergic, itching and postpartum back pain. Confirmed with bedside nurse the patient's most recent platelet count. Confirmed with patient that they are not currently taking any anticoagulation, have any bleeding history or any family history of bleeding disorders. Patient expressed understanding and wished to proceed. All questions were answered. Sterile technique was used throughout the entire procedure. Please see nursing notes for vital signs. Test dose was given through epidural needle and negative prior to continuing to dose epidural or start infusion. Warning signs of high block given to the patient including shortness of breath, tingling/numbness in hands, complete motor block, or any concerning symptoms with instructions to call for help. Patient was given  instructions on fall risk and not to get out of bed. All questions and concerns addressed with instructions to call with any issues.   

## 2015-09-20 NOTE — Anesthesia Preprocedure Evaluation (Signed)

## 2015-09-20 NOTE — Progress Notes (Addendum)
[employer] used by phone to translate for MD and RN

## 2015-09-21 ENCOUNTER — Encounter (HOSPITAL_COMMUNITY): Payer: Self-pay

## 2015-09-21 LAB — RPR: RPR: NONREACTIVE

## 2015-09-21 MED ORDER — ONDANSETRON HCL 4 MG PO TABS: 4.0000 mg | ORAL_TABLET | ORAL | Status: DC | PRN

## 2015-09-21 MED ORDER — SIMETHICONE 80 MG PO CHEW: 80.0000 mg | CHEWABLE_TABLET | ORAL | Status: DC | PRN

## 2015-09-21 MED ORDER — WITCH HAZEL-GLYCERIN EX PADS: 1.0000 "application " | MEDICATED_PAD | CUTANEOUS | Status: DC | PRN

## 2015-09-21 MED ORDER — LANOLIN HYDROUS EX OINT: TOPICAL_OINTMENT | CUTANEOUS | Status: DC | PRN

## 2015-09-21 MED ORDER — DOCUSATE SODIUM 100 MG PO CAPS
100.0000 mg | ORAL_CAPSULE | Freq: Two times a day (BID) | ORAL | Status: DC
  Administered 2015-09-21 – 2015-09-22 (×2): 100 mg via ORAL
  Filled 2015-09-21 (×2): qty 1

## 2015-09-21 MED ORDER — OXYCODONE-ACETAMINOPHEN 5-325 MG PO TABS: 1.0000 | ORAL_TABLET | ORAL | Status: DC | PRN

## 2015-09-21 MED ORDER — OXYCODONE-ACETAMINOPHEN 5-325 MG PO TABS: 2.0000 | ORAL_TABLET | ORAL | Status: DC | PRN

## 2015-09-21 MED ORDER — LACTATED RINGERS IV SOLN: INTRAVENOUS | Status: DC

## 2015-09-21 MED ORDER — IBUPROFEN 600 MG PO TABS
600.0000 mg | ORAL_TABLET | Freq: Four times a day (QID) | ORAL | Status: DC
  Administered 2015-09-21 – 2015-09-22 (×5): 600 mg via ORAL
  Filled 2015-09-21 (×6): qty 1

## 2015-09-21 MED ORDER — OXYTOCIN 40 UNITS IN LACTATED RINGERS INFUSION - SIMPLE MED: 62.5000 mL/h | INTRAVENOUS | Status: DC | PRN

## 2015-09-21 MED ORDER — ONDANSETRON HCL 4 MG/2ML IJ SOLN: 4.0000 mg | INTRAMUSCULAR | Status: DC | PRN

## 2015-09-21 MED ORDER — SENNOSIDES-DOCUSATE SODIUM 8.6-50 MG PO TABS
2.0000 | ORAL_TABLET | ORAL | Status: DC
  Administered 2015-09-21: 2 via ORAL
  Filled 2015-09-21: qty 2

## 2015-09-21 MED ORDER — DIPHENHYDRAMINE HCL 25 MG PO CAPS: 25.0000 mg | ORAL_CAPSULE | Freq: Four times a day (QID) | ORAL | Status: DC | PRN

## 2015-09-21 MED ORDER — ZOLPIDEM TARTRATE 5 MG PO TABS: 5.0000 mg | ORAL_TABLET | Freq: Every evening | ORAL | Status: DC | PRN

## 2015-09-21 MED ORDER — PRENATAL MULTIVITAMIN CH
1.0000 | ORAL_TABLET | Freq: Every day | ORAL | Status: DC
Start: 2015-09-21 — End: 2015-09-22
  Administered 2015-09-21 – 2015-09-22 (×2): 1 via ORAL
  Filled 2015-09-21 (×2): qty 1

## 2015-09-21 MED ORDER — DIBUCAINE 1 % RE OINT: 1.0000 "application " | TOPICAL_OINTMENT | RECTAL | Status: DC | PRN

## 2015-09-21 MED ORDER — TETANUS-DIPHTH-ACELL PERTUSSIS 5-2.5-18.5 LF-MCG/0.5 IM SUSP
0.5000 mL | Freq: Once | INTRAMUSCULAR | Status: AC
  Administered 2015-09-22: 0.5 mL via INTRAMUSCULAR
  Filled 2015-09-21: qty 0.5

## 2015-09-21 MED ORDER — MEASLES, MUMPS & RUBELLA VAC ~~LOC~~ INJ
0.5000 mL | INJECTION | Freq: Once | SUBCUTANEOUS | Status: DC
  Filled 2015-09-21: qty 0.5

## 2015-09-21 MED ORDER — BENZOCAINE-MENTHOL 20-0.5 % EX AERO
1.0000 "application " | INHALATION_SPRAY | CUTANEOUS | Status: DC | PRN
  Filled 2015-09-21: qty 56

## 2015-09-21 MED ORDER — ACETAMINOPHEN 325 MG PO TABS: 650.0000 mg | ORAL_TABLET | ORAL | Status: DC | PRN

## 2015-09-21 NOTE — Anesthesia Postprocedure Evaluation (Signed)
Anesthesia Post Note  Patient: Phyllis RouxBhadri Lopez  Procedure(s) Performed: * No procedures listed *  Patient location during evaluation: Mother Baby Anesthesia Type: Epidural Level of consciousness: awake and alert, oriented and patient cooperative Pain management: pain level controlled Vital Signs Assessment: post-procedure vital signs reviewed and stable Respiratory status: spontaneous breathing Cardiovascular status: stable Postop Assessment: no headache, epidural receding, patient able to bend at knees and no signs of nausea or vomiting Anesthetic complications: no    Last Vitals:  Filed Vitals:   09/21/15 0402 09/21/15 0500  BP: 120/46 102/48  Pulse: 87 96  Temp:  37.2 C  Resp: 16 18    Last Pain:  Filed Vitals:   09/21/15 0514  PainSc: 0-No pain                 Rithvik Orcutt

## 2015-09-22 DIAGNOSIS — IMO0001 Reserved for inherently not codable concepts without codable children: Secondary | ICD-10-CM

## 2015-09-22 MED ORDER — DOCUSATE SODIUM 100 MG PO CAPS: 100.0000 mg | ORAL_CAPSULE | Freq: Two times a day (BID) | ORAL | Status: DC

## 2015-09-22 MED ORDER — IBUPROFEN 600 MG PO TABS: 600.0000 mg | ORAL_TABLET | Freq: Four times a day (QID) | ORAL | Status: DC

## 2015-09-22 NOTE — Discharge Instructions (Signed)

## 2015-09-22 NOTE — Discharge Summary (Signed)
OB Discharge Summary     Patient Name: Phyllis Lopez DOB: 07/08/1996 MRN: [id number]  Date of admission: 09/20/2015 Delivering MD: Shonna ChockWOUK, NOAH BEDFORD   Date of discharge: 09/22/2015  Admitting diagnosis: 41WKS INDUCTION Intrauterine pregnancy: 361w1d     Secondary diagnosis:  Principal Problem:   Status post vaginal delivery Active Problems:   Language barrier, speaks Nepali only   Post term pregnancy, 41 weeks  Additional problems:None     Discharge diagnosis: Term Pregnancy Delivered                                                                                                Post partum procedures:None  Augmentation: AROM, Pitocin and Cytotec Complications: Prolonged decelerations requiring Terbutaline   Hospital course:  Induction of Labor With Vaginal Delivery   19 y.o. yo G1P1001 at 251w1d was admitted to the hospital 09/20/2015 for induction of labor.  Indication for induction: Postdates.  Patient initially started on cytotec for  Cervical ripening. During induction, FHR decerelated to a nadir of 90s. Neonatal intrauterine resuscitation maneuvers including positional changes, oxygen by facial mask were employed. Pitocin was at 2 mu/min and has been discontinued. Terbutaline 0.25 mg  x 1 was immediately ordered and was given to patient. Patient was then restarted several hours later on Pitocin, and up-titrated. Patient had an uncomplicated labor course as follows: Membrane Rupture Time/Date: 11:20 PM ,09/20/2015   Intrapartum Procedures: Episiotomy: None [1]                                         Lacerations:  2nd degree [3];Perineal [11]  Patient had delivery of a Viable infant.  Information for the patient's newborn:  Ilda BassetKarki, Boy Tyrika [[id number]][[id number]]  Delivery Method: Vaginal, Spontaneous Delivery (Filed from Delivery Summary)   09/21/2015  Details of delivery can be found in separate delivery note.  Patient had a routine postpartum course. Patient is discharged home  09/22/2015   Physical exam  Filed Vitals:   09/21/15 0402 09/21/15 0500 09/21/15 1740 09/22/15 0528  BP: 120/46 102/48 98/42 105/50  Pulse: 87 96 100 78  Temp:  99 F (37.2 C) 98.2 F (36.8 C) 98.5 F (36.9 C)  TempSrc:  Oral Oral Oral  Resp: 16 18 18 18   Height:      Weight:      SpO2:   99%    General: alert, cooperative and no distress Lochia: appropriate Uterine Fundus: firm Incision: N/A DVT Evaluation: No evidence of DVT seen on physical exam. Labs: Lab Results  Component Value Date   WBC 14.7* 09/20/2015   HGB 12.2 09/20/2015   HCT 36.4 09/20/2015   MCV 86.3 09/20/2015   PLT 153 09/20/2015   No flowsheet data found.  Discharge instruction: per After Visit Summary and "Baby and Me Booklet".  After visit meds:    Medication List    TAKE these medications        docusate sodium 100 MG capsule  Commonly known as:  COLACE  Take  1 capsule (100 mg total) by mouth 2 (two) times daily.     ibuprofen 600 MG tablet  Commonly known as:  ADVIL,MOTRIN  Take 1 tablet (600 mg total) by mouth every 6 (six) hours.     PRENATAL VITAMIN PO  Take 1 tablet by mouth daily.        Diet: routine diet  Activity: Advance as tolerated. Pelvic rest for 6 weeks.   Outpatient follow up:6 weeks Follow up Appt:No future appointments. Follow up Visit:No Follow-up on file.  Postpartum contraception: Depo Provera  Newborn Data: Live born female  Birth Weight: 7 lb 9 oz (3430 g) APGAR: 9, 9  Baby Feeding: Bottle Disposition:home with mother   09/22/2015 Danella Maiers, MD

## 2015-10-16 ENCOUNTER — Emergency Department (HOSPITAL_COMMUNITY)
Admission: EM | Admit: 2015-10-16 | Discharge: 2015-10-16 | Disposition: A | Discharge status: Home / Self Care | Payer: 59 | Attending: Emergency Medicine | Admitting: Emergency Medicine

## 2015-10-16 ENCOUNTER — Encounter (HOSPITAL_COMMUNITY): Payer: Self-pay | Admitting: Cardiology

## 2015-10-16 DIAGNOSIS — J029 Acute pharyngitis, unspecified: Secondary | ICD-10-CM | POA: Diagnosis not present

## 2015-10-16 DIAGNOSIS — Z79899 Other long term (current) drug therapy: Secondary | ICD-10-CM | POA: Diagnosis not present

## 2015-10-16 LAB — RAPID STREP SCREEN (MED CTR MEBANE ONLY): Streptococcus, Group A Screen (Direct): NEGATIVE

## 2015-10-16 MED ORDER — ACETAMINOPHEN 325 MG PO TABS
650.0000 mg | ORAL_TABLET | Freq: Once | ORAL | Status: AC
  Administered 2015-10-16: 650 mg via ORAL
  Filled 2015-10-16: qty 2

## 2015-10-16 MED ORDER — ACETAMINOPHEN 325 MG PO CAPS: 325.0000 mg | ORAL_CAPSULE | Freq: Four times a day (QID) | ORAL | Status: DC | PRN

## 2015-10-16 NOTE — Discharge Instructions (Signed)

## 2015-10-16 NOTE — ED Notes (Signed)
Reports cough, sore throat and fever at home since yesterday.

## 2015-10-16 NOTE — ED Provider Notes (Signed)
CSN: [id number]     Arrival date & time 10/16/15  1019 History  By signing my name below, I, Essence Howell, attest that this documentation has been prepared under the direction and in the presence of Marlon Peliffany Makailey Hodgkin, PA-C Electronically Signed: Charline BillsEssence Howell, ED Scribe 10/16/2015 at 12:35 PM.   Chief Complaint  Patient presents with  . Sore Throat  . Cough   The history is provided by the patient. No language interpreter was used.   HPI Comments: Eduard RouxBhadri Amico is a 19 y.o. female who presents to the Emergency Department complaining of persistent sore throat onset yesterday. Pt reports constant pain that is exacerbated with swallowing and worse at night. She reports associated dry cough and fever with Tmax of 100.5 F yesterday. ED temperature 97.5 F. No treatments tried PTA; she is breastfeeding her 5328 day old son. She denies ear pain, abdominal pain, vomiting, back pain, rash.  Past Medical History  Diagnosis Date  . Healthy adult    Past Surgical History  Procedure Laterality Date  . Eye surgery  2011    lazy eye   Family History  Problem Relation Age of Onset  . Diabetes Neg Hx   . Hypertension Neg Hx    Social History  Substance Use Topics  . Smoking status: Never Smoker   . Smokeless tobacco: None  . Alcohol Use: No   OB History    Gravida Para Term Preterm AB TAB SAB Ectopic Multiple Living   1 1 1       0 1     Review of Systems  Constitutional: Positive for fever.  HENT: Positive for sore throat. Negative for ear pain.   Respiratory: Positive for cough.   Gastrointestinal: Negative for vomiting and abdominal pain.  Musculoskeletal: Negative for back pain.  Skin: Negative for rash.  All other systems reviewed and are negative.  Allergies  Review of patient's allergies indicates no known allergies.  Home Medications   Prior to Admission medications   Medication Sig Start Date End Date Taking? Authorizing Provider  Acetaminophen (TYLENOL) 325 MG CAPS Take 325  mg by mouth every 6 (six) hours as needed. 10/16/15   Tamaj Jurgens Neva SeatGreene, PA-C  docusate sodium (COLACE) 100 MG capsule Take 1 capsule (100 mg total) by mouth 2 (two) times daily. 09/22/15   Asiyah Mayra ReelZahra Mikell, MD  ibuprofen (ADVIL,MOTRIN) 600 MG tablet Take 1 tablet (600 mg total) by mouth every 6 (six) hours. 09/22/15   Asiyah Mayra ReelZahra Mikell, MD  Prenatal Vit-Fe Fumarate-FA (PRENATAL VITAMIN PO) Take 1 tablet by mouth daily.     Historical Provider, MD   BP 105/65 mmHg  Pulse 81  Temp(Src) 97.5 F (36.4 C) (Oral)  Resp 18  Wt 154 lb (69.854 kg)  SpO2 99% Physical Exam  Constitutional: She is oriented to person, place, and time. She appears well-developed and well-nourished. No distress.  HENT:  Head: Normocephalic and atraumatic.  Right Ear: Tympanic membrane, external ear and ear canal normal.  Left Ear: Tympanic membrane, external ear and ear canal normal.  Nose: Nose normal. No rhinorrhea. Right sinus exhibits no maxillary sinus tenderness and no frontal sinus tenderness. Left sinus exhibits no maxillary sinus tenderness and no frontal sinus tenderness.  Mouth/Throat: Uvula is midline and mucous membranes are normal. No trismus in the jaw. Normal dentition. No dental abscesses or uvula swelling. No oropharyngeal exudate, posterior oropharyngeal edema, posterior oropharyngeal erythema or tonsillar abscesses.  Eyes: Conjunctivae and EOM are normal.  Neck: Trachea normal, normal range of  motion and full passive range of motion without pain. Neck supple. No rigidity. No tracheal deviation and normal range of motion present. No Brudzinski's sign noted.  Flexion and extension of neck without pain or difficulty. Able to breath without difficulty in extension.  Cardiovascular: Normal rate and regular rhythm.   Pulmonary/Chest: Effort normal and breath sounds normal. No stridor. No respiratory distress. She has no wheezes.  Abdominal: Soft. There is no tenderness.  No obvious evidence of splenomegaly.  Non ttp.   Musculoskeletal: Normal range of motion.  Lymphadenopathy:       Head (right side): No preauricular and no posterior auricular adenopathy present.       Head (left side): No preauricular and no posterior auricular adenopathy present.    She has no cervical adenopathy.  Neurological: She is alert and oriented to person, place, and time.  Skin: Skin is warm and dry. No rash noted. She is not diaphoretic.  Psychiatric: She has a normal mood and affect. Her behavior is normal.  Nursing note and vitals reviewed.  ED Course  Procedures (including critical care time) DIAGNOSTIC STUDIES: Oxygen Saturation is 99% on RA, normal by my interpretation.    COORDINATION OF CARE: 12:33 PM-Discussed treatment plan which includes strep screen and Tylenol with pt at bedside and pt agreed to plan.   Labs Review Labs Reviewed  RAPID STREP SCREEN (NOT AT [facility])  CULTURE, GROUP A STREP   Imaging Review No results found. I have personally reviewed and evaluated these images and lab results as part of my medical decision-making.   EKG Interpretation None      MDM   Final diagnoses:  Pharyngitis   Patients symptoms are consistent with URI, likely viral etiology. Negative strep screen. Pt will well appearing and breast feeding- tylenol for symptomatic control. Discussed that antibiotics are not indicated for viral infections. Pt will be discharged with symptomatic treatment.  Verbalizes understanding and is agreeable with plan. Pt is hemodynamically stable & in NAD prior to dc.   I personally performed the services described in this documentation, which was scribed in my presence. The recorded information has been reviewed and is accurate.    Marlon Pel, PA-C 10/16/15 1239  Donnetta Hutching, MD 10/16/15 [phone]

## 2015-10-18 LAB — CULTURE, GROUP A STREP: STREP A CULTURE: NEGATIVE

## 2016-11-09 ENCOUNTER — Ambulatory Visit (INDEPENDENT_AMBULATORY_CARE_PROVIDER_SITE_OTHER): Payer: Self-pay | Admitting: Medical

## 2016-11-09 ENCOUNTER — Encounter: Payer: Self-pay | Admitting: Medical

## 2016-11-09 VITALS — BP 108/60 | HR 101 | Temp 98.7°F | Wt 130.8 lb

## 2016-11-09 DIAGNOSIS — R6889 Other general symptoms and signs: Secondary | ICD-10-CM

## 2016-11-09 DIAGNOSIS — J111 Influenza due to unidentified influenza virus with other respiratory manifestations: Secondary | ICD-10-CM

## 2016-11-09 LAB — POC INFLUENZA A&B (BINAX/QUICKVUE)
Influenza A, POC: POSITIVE — AB
Influenza B, POC: NEGATIVE

## 2016-11-09 NOTE — Progress Notes (Signed)
  Subjective:  Phyllis Lopez is a 21 y.o. female who presents for fever, illness.  Used interpreter [phone] with [employer], Nepali interpreter.  She reports 4 day hx/o fever, cough, sore throat, runny nose, body aches, chills, dizzy, one episode of vomiting, but no diarrhea, no urinary or bowel issue otherwise.  No sick contacts.  Using some ibuprofen OTC.  No other aggravating or relieving factors.  No other c/o.  The following portions of the patient's history were reviewed and updated as appropriate: allergies, current medications, past medical history, past social history and problem list.  ROS as in subjective   Past Medical History:  Diagnosis Date  . Healthy adult      Objective: BP 108/60   Pulse (!) 101   Temp 98.7 F (37.1 C)   Wt 130 lb 12.8 oz (59.3 kg)   SpO2 98%   BMI 23.92 kg/m   General: Ill-appearing, well-developed, well-nourished Skin: Hot, dry HEENT: Nose inflamed and congested, clear conjunctiva, TMs pearly, no sinus tenderness, pharynx with erythema, no exudates Neck: Supple, non tender, shotty cervical adenopathy Heart: Regular rate and rhythm, normal S1, S2, no murmurs Lungs: Clear to auscultation bilaterally, no wheezes, rales, rhonchi Abdomen: Non tender non distended Extremities: Mild generalized tenderness   Assessment: Encounter Diagnoses  Name Primary?  . Influenza Yes  . Flu-like symptoms      Plan: discussed diagnosis of influenza.  Discussed supportive care including rest, hydration, OTC Tylenol or NSAID for fever, aches, and malaise.  Discussed period of contagion, self quarantine at home away from others to avoid spread of disease, discussed means of transmission, and possible complications including pneumonia.  If worse or not improving within the next 4-5 days, then call or return.  Patient voiced understanding of diagnosis, recommendations, and treatment plan.  After visit summary given.  Gave note for work.

## 2016-11-09 NOTE — Patient Instructions (Signed)
Recommendations:  You tested positive for influenza, the flu  The flu is a virus that causes body aches, chills, cough, congestion, body aches, fever.  Flu symptoms normally last 7- 10 days  Your main job is to REST, stay hydrated with clear fluids, and treat the symptoms  You can use over the counter Ibuprofen 200mg , 3 tablets 3 times daily for fever, body aches, chills  Use the ibuprofen for the next 3-5 days  You can use Emetrol over the counter for nausea  You can use Theraflu over the counter for body aches, chills  If you are worse in the next few days, such as high fever 102+, uncontrollable nausea and vomiting, not able to drink fluids, then get checked back out

## 2016-12-09 IMAGING — US US MFM FETAL NUCHAL TRANSLUCENCY
1 series · 13 of 26 positions shown · non-contrast
Comparison: none

[Series 1: us mfm fetal nuchal translucency · 13 of 26 slices shown]
[im 2/26]
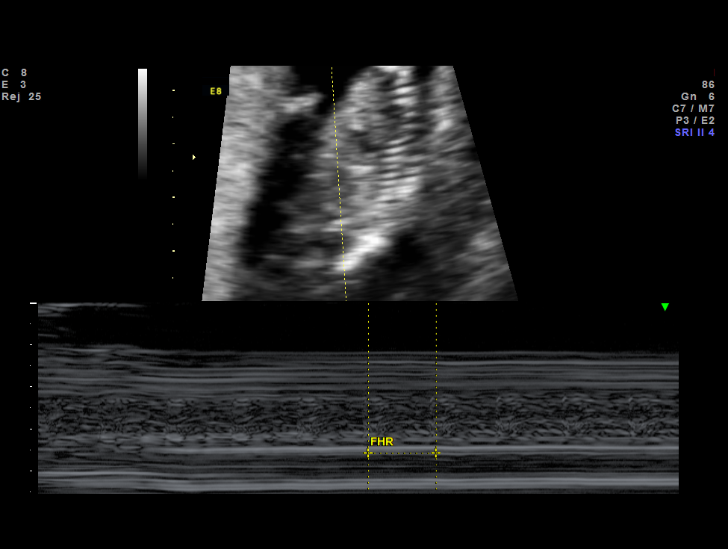
[im 4/26]
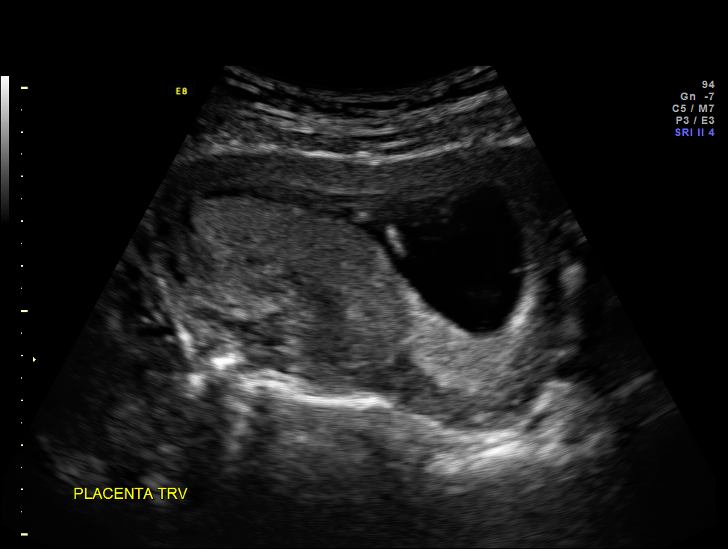
[im 6/26]
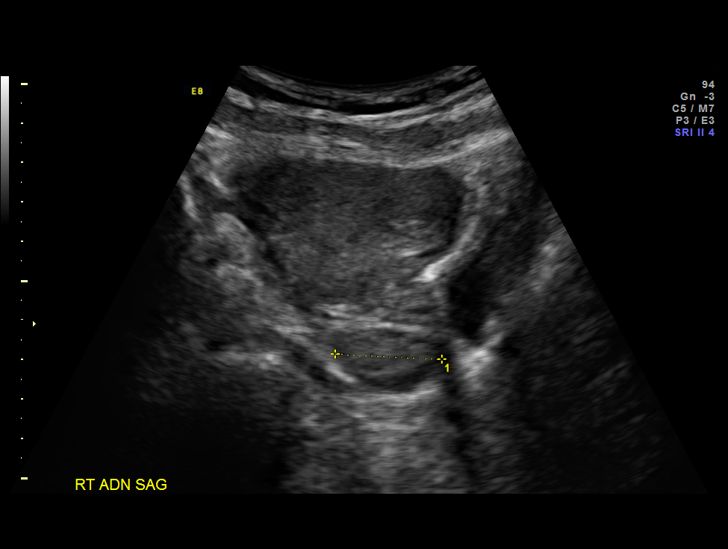
[im 8/26]
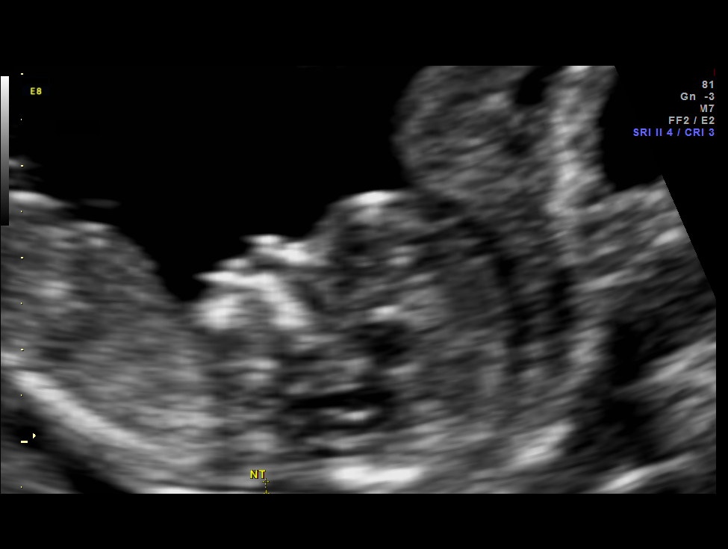
[im 10/26]
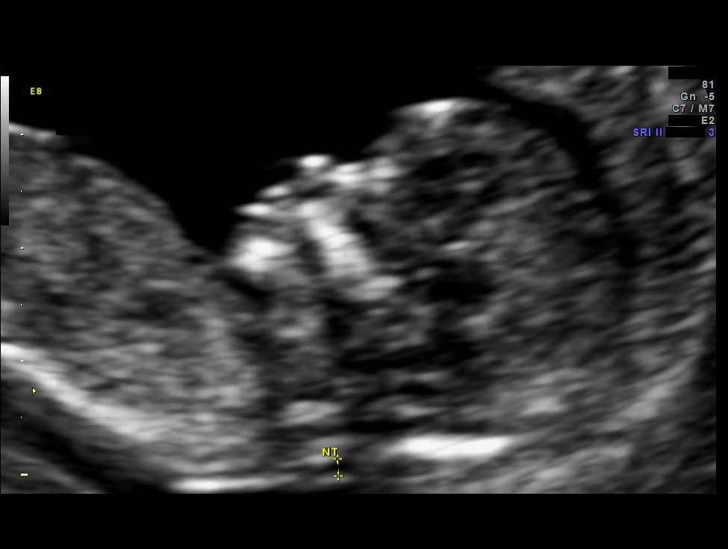
[im 12/26]
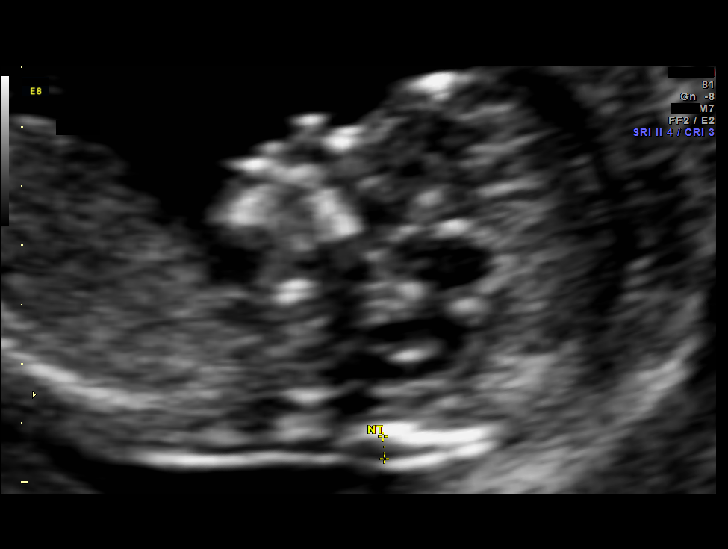
[im 14/26]
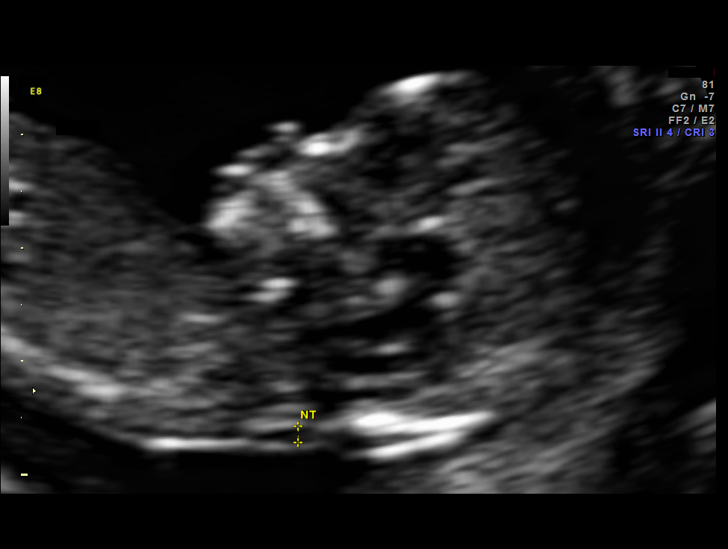
[im 16/26]
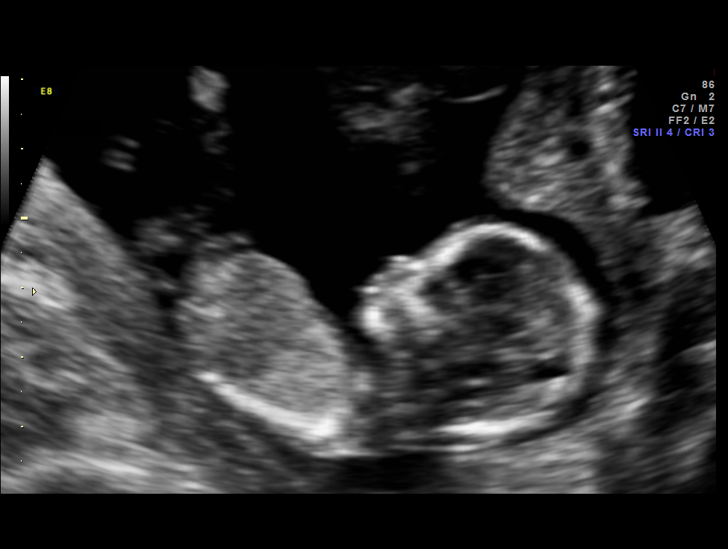
[im 18/26]
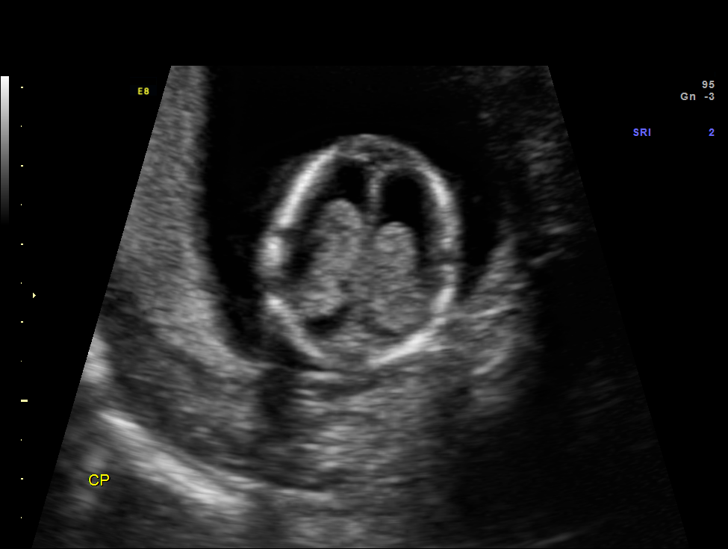
[im 20/26]
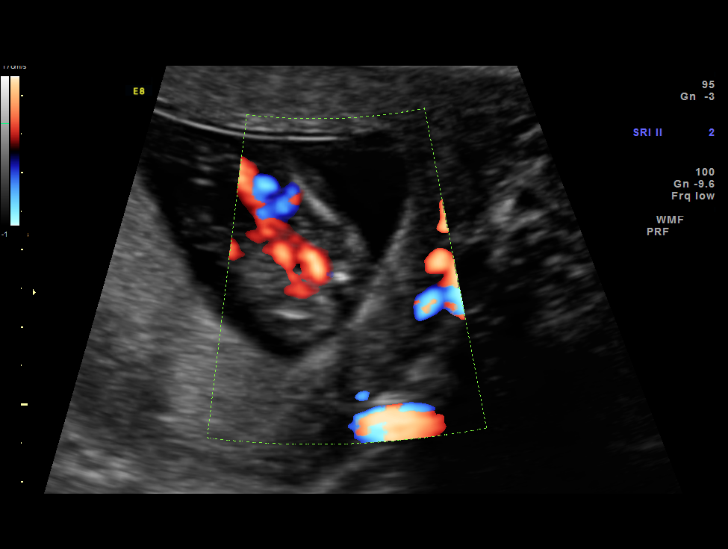
[im 22/26]
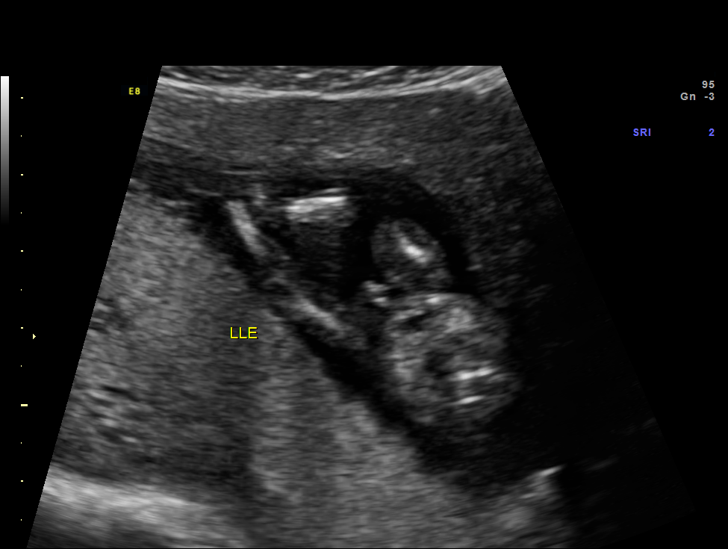
[im 24/26]
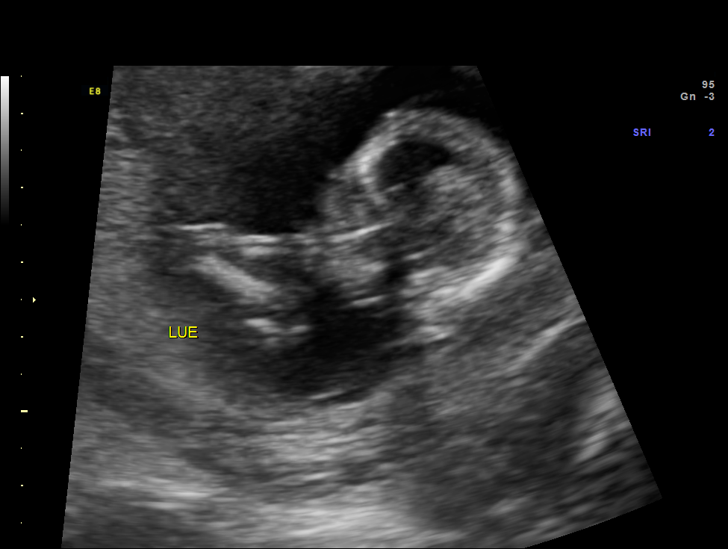
[im 26/26]
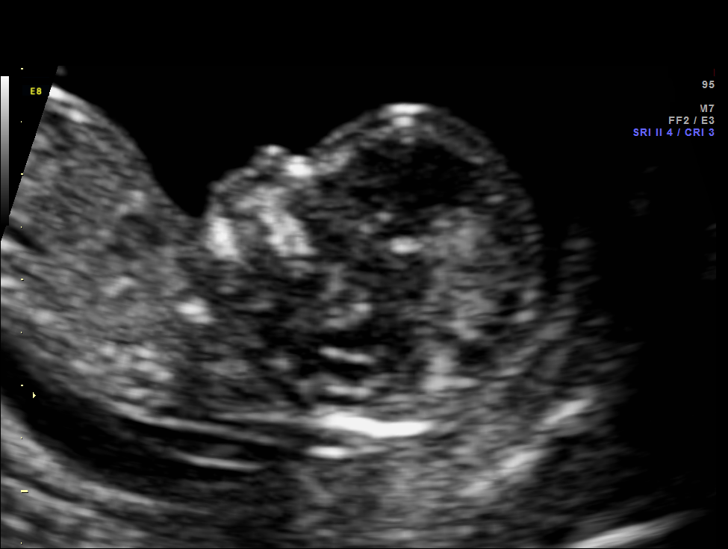

[13 of 26 positions shown; findings below may reference images not displayed]

OBSTETRICS REPORT
(Signed Final 03/07/2015 [DATE])

Service(s) Provided

Indications

12 weeks gestation of pregnancy
First trimester aneuploidy screen (NT)                Z36
Fetal Evaluation

Num Of Fetuses:    1
Fetal Heart Rate:  148                          bpm
Cardiac Activity:  Observed
Presentation:      Cephalic
Placenta:          Posterior

Amniotic Fluid
AFI FV:      Subjectively within normal limits
Gestational Age

LMP:           12w 6d        Date:  12/07/14                 EDD:   09/13/15
Best:          12w 6d     Det. By:  LMP  (12/07/14)          EDD:   09/13/15
1st Trimester Genetic Sonogram Screening

CRL:            73.1  mm    G. Age:   13w 1d                 EDD:   09/11/15
Nuc Trans:       2.1  mm
Nasal Bone:                 Present
Anatomy

Cranium:          Appears normal         Cord Vessels:     Appears normal (3
vessel cord)
Choroid Plexus:   Appears normal         Lower             Visualized
Extremities:
Stomach:          Appears normal, left   Upper             Visualized
sided                  Extremities:
Abdominal Wall:   Appears nml (cord
insert, abd wall)
Cervix Uterus Adnexa

Cervix:       Normal appearance by transabdominal scan.
Left Ovary:    Within normal limits.
Right Ovary:   Within normal limits.
Adnexa:     No abnormality visualized.
Impression

SIUP at 12+6 weeks
No gross abnormalities identified
NT measurement was within normal limits for this GA; NB
present
Normal amniotic fluid volume
Measurements consistent with LMP dating
Recommendations

Offer MSAFP in the second trimester for ONTD screening
Offer anatomy U/S by 18 weeks

questions or concerns.

## 2016-12-14 ENCOUNTER — Encounter: Payer: Self-pay | Admitting: Pediatrics

## 2016-12-16 ENCOUNTER — Encounter: Payer: Self-pay | Admitting: Pediatrics

## 2017-02-21 ENCOUNTER — Ambulatory Visit
Admission: RE | Admit: 2017-02-21 | Discharge: 2017-02-21 | Disposition: A | Discharge status: Home / Self Care | Payer: 59 | Source: Ambulatory Visit | Attending: Medical | Admitting: Medical

## 2017-02-21 ENCOUNTER — Encounter: Payer: Self-pay | Admitting: Medical

## 2017-02-21 ENCOUNTER — Ambulatory Visit (INDEPENDENT_AMBULATORY_CARE_PROVIDER_SITE_OTHER): Payer: Self-pay | Admitting: Medical

## 2017-02-21 VITALS — BP 110/70 | HR 91 | Wt 132.8 lb

## 2017-02-21 DIAGNOSIS — M25471 Effusion, right ankle: Secondary | ICD-10-CM

## 2017-02-21 DIAGNOSIS — M79671 Pain in right foot: Secondary | ICD-10-CM

## 2017-02-21 DIAGNOSIS — S99921A Unspecified injury of right foot, initial encounter: Secondary | ICD-10-CM

## 2017-02-21 DIAGNOSIS — S99911A Unspecified injury of right ankle, initial encounter: Secondary | ICD-10-CM

## 2017-02-21 DIAGNOSIS — M25571 Pain in right ankle and joints of right foot: Secondary | ICD-10-CM

## 2017-02-21 NOTE — Progress Notes (Signed)
  Subjective:  Phyllis Lopez is aEduard Lopez 21 y.o. female who presents for right ankle pain.  Here with interpreter from [employer]  Chief Complaint  Patient presents with  . Ankle Pain    ankle pain fell down stairs on sunday    DOI: 02/20/17  They report ankle injured while going down stairs at her home carrying her baby.  she inadvertantly missed the 2nd to last step.  landed usually on her right foot and ankle.  She fortunately didn't drop the baby.  No other injury , just the foot and ankle.  Since then has had pain in right foot and ankle, swelling, can't fully bear weight.  She notes ability to bear weight, but with some pain, pain at the lateral and dorsal aspect of the ankle and swelling.  Pain: No change since onset Other aggravating factors: none Relieving factors: rest and ice No other aggravating or relieving factors.    No other c/o.  The following portions of the patient's history were reviewed and updated as appropriate: allergies, current medications, past family history, past medical history, past social history, past surgical history and problem list.  ROS Denies numbness, tingling No knee or other leg injury or pain reported Otherwise as in subjective above  Objective:Exam Vital signs reviewed BP 110/70   Pulse 91   Wt 132 lb 12.8 oz (60.2 kg)   SpO2 98%   BMI 24.29 kg/m   General appearance: no acute distress, alert/oriented x3, appropriate mood and affect, looks stated age and well nourished The examination was performed out of splint/cast Skin: normal Swelling: none and mild Warmth: no warmth Tenderness: diffuse along dorsal foot over base of 4th and 5th metatarsals as well as cuboid and lateral ankle ligaments anteriorly, otherwise nontneder, pain with ROM in general and weight bearing ROM: limited by pain Strength: limited by pain Gait: guarding right foot Stability: stable to testing Crepitus: no Neurological Exam: normal Vascular Exam:  normal   Assessment: Encounter Diagnoses  Name Primary?  Phyllis Lopez. Ankle injury, right, initial encounter Yes  . Ankle swelling, right   . Acute right ankle pain   . Right foot pain   . Right foot injury, initial encounter      Plan: Discussed injury, findings, treatment recommendations, xray order.  Dx: sprain vs possible fracture.   Advised the following: Rest, ice, compression, elevation (RICE) therapy. Crutches and instructions provided. OTC NSAIDs advised elastic support bandage if no fracture. Gave note for work to limit walking for this week. Go for xray. Follow up: 7-10 days otherwise

## 2017-02-21 NOTE — Patient Instructions (Signed)
Right ankle injury  Use ice water bath to the ankle and foot for 20 minutes two to three times daily for the next 3-4 days  Try to stay off the foot and let it rest.  Use crutches for the next week to avoid further injury to the foot  Use compression such as an ankle brace or ACE wrap/compression wrap to the ankle for a week, unless there is a fracture  Go for xray today  Use over the counter Aleve, 2 tablets twice daily for a week for pain and inflammation  If not much improved within 7-10 days then recheck  If much improved after a week, then start stretching the ankle and bearing some weight gradually over the next week

## 2017-11-25 ENCOUNTER — Ambulatory Visit: Payer: Self-pay | Admitting: Medical

## 2018-02-28 ENCOUNTER — Ambulatory Visit: Payer: BLUE CROSS/BLUE SHIELD | Admitting: Medical

## 2018-02-28 ENCOUNTER — Encounter: Payer: Self-pay | Admitting: Medical

## 2018-02-28 ENCOUNTER — Other Ambulatory Visit (HOSPITAL_COMMUNITY)
Admission: RE | Admit: 2018-02-28 | Discharge: 2018-02-28 | Disposition: A | Discharge status: Home / Self Care | Payer: BLUE CROSS/BLUE SHIELD | Source: Ambulatory Visit | Attending: Medical | Admitting: Medical

## 2018-02-28 VITALS — BP 104/66 | HR 97 | Temp 98.1°F | Ht 61.75 in | Wt 142.6 lb

## 2018-02-28 DIAGNOSIS — Z124 Encounter for screening for malignant neoplasm of cervix: Secondary | ICD-10-CM | POA: Insufficient documentation

## 2018-02-28 DIAGNOSIS — Z113 Encounter for screening for infections with a predominantly sexual mode of transmission: Secondary | ICD-10-CM

## 2018-02-28 DIAGNOSIS — R300 Dysuria: Secondary | ICD-10-CM | POA: Diagnosis not present

## 2018-02-28 DIAGNOSIS — N898 Other specified noninflammatory disorders of vagina: Secondary | ICD-10-CM | POA: Diagnosis not present

## 2018-02-28 LAB — POCT WET PREP (WET MOUNT)
KOH WET PREP POC: NEGATIVE
TRICHOMONAS WET PREP HPF POC: ABSENT

## 2018-02-28 LAB — POCT URINALYSIS DIP (PROADVANTAGE DEVICE)
Bilirubin, UA: NEGATIVE
Blood, UA: NEGATIVE
GLUCOSE UA: NEGATIVE mg/dL
Ketones, POC UA: NEGATIVE mg/dL
Leukocytes, UA: NEGATIVE
Nitrite, UA: NEGATIVE
PH UA: 6 (ref 5.0–8.0)
Protein Ur, POC: NEGATIVE mg/dL

## 2018-02-28 LAB — POCT URINE PREGNANCY: PREG TEST UR: NEGATIVE

## 2018-02-28 MED ORDER — SULFAMETHOXAZOLE-TRIMETHOPRIM 800-160 MG PO TABS: 1.0000 | ORAL_TABLET | Freq: Two times a day (BID) | ORAL | 0 refills | Status: DC

## 2018-02-28 NOTE — Patient Instructions (Signed)
Recommendations  Begin Bactrim antibiotic twice daily for 5 days, oral medication  You can use over the counter Azo medication to help with the burning  Drink some cranberry juice for a few days to help the infection  Drink plenty of water daily  If you get improvement but get a recurrence of the same symptoms in the next month, then recheck  We sent your urine today for urine culture  We sent your pap smear cancer screen today  We will call with results    Urinary Tract Infection, Adult A urinary tract infection (UTI) is an infection of any part of the urinary tract. The urinary tract includes the:  Kidneys.  Ureters.  Bladder.  Urethra.  These organs make, store, and get rid of pee (urine) in the body. Follow these instructions at home:  Take over-the-counter and prescription medicines only as told by your doctor.  If you were prescribed an antibiotic medicine, take it as told by your doctor. Do not stop taking the antibiotic even if you start to feel better.  Avoid the following drinks: ? Alcohol. ? Caffeine. ? Tea. ? Carbonated drinks.  Drink enough fluid to keep your pee clear or pale yellow.  Keep all follow-up visits as told by your doctor. This is important.  Make sure to: ? Empty your bladder often and completely. Do not to hold pee for long periods of time. ? Empty your bladder before and after sex. ? Wipe from front to back after a bowel movement if you are female. Use each tissue one time when you wipe. Contact a doctor if:  You have back pain.  You have a fever.  You feel sick to your stomach (nauseous).  You throw up (vomit).  Your symptoms do not get better after 3 days.  Your symptoms go away and then come back. Get help right away if:  You have very bad back pain.  You have very bad lower belly (abdominal) pain.  You are throwing up and cannot keep down any medicines or water. This information is not intended to replace advice  given to you by your health care provider. Make sure you discuss any questions you have with your health care provider. Document Released: 03/22/2008 Document Revised: 03/11/2016 Document Reviewed: 08/25/2015 Elsevier Interactive Patient Education  [employer].

## 2018-02-28 NOTE — Progress Notes (Signed)
Subjective: Chief Complaint  Patient presents with  . Urinary Tract Infection    burning with urination    Here with interpreter Arti from [facility] language services.   Here for burning with urination.  has been feeling burning 5-6 months.   Use to go to public health dept.   Has seen health dept for this prior, was told she had germs, but not sure what type of infection.  Nonsmoker, no alcohol.  Doesn't drink a lot of caffeine, only drinks water.  No prior pap smear  She has used medication prior for suspected urinary tract infection  Currently has burning with urination.  No urinary frequency, no back or abdominal pain.  No blood in urine.  Urine is more yellow than normal but no odor.  +does have urgency.  Is consistent with soap products.     No vaginal discharge.  Does have vaginal itching 15-20 days.   No vaginal odor.   Is sexually active with 1 partner lifetime.  No hx/o STD  No recent antibiotics.    LMP around a year ago before starting Nexplanon.   Nexplanon placed 1 year ago in June, health dept.   Past Medical History:  Diagnosis Date  . Healthy adult    No current outpatient medications on file prior to visit.   No current facility-administered medications on file prior to visit.    ROS as in subjective    Objective: BP 104/66   Pulse 97   Temp 98.1 F (36.7 C) (Oral)   Ht 5' 1.75" (1.568 m)   Wt 142 lb 9.6 oz (64.7 kg)   SpO2 97%   BMI 26.29 kg/m   Gen: wd, wn, nad Abdomen: +bs, soft, nontender, no mass, no organomegaly Back: nontender Gyn: Normal external genitalia without lesions, vagina with normal mucosa, cervix with mild erythema, no cervical motion tenderness, no abnormal vaginal discharge.  Uterus and adnexa not enlarged, nontender, no masses.  Pap performed.  Exam chaperoned by nurse. Rectal: deferred      Assessment: Encounter Diagnoses  Name Primary?  . Burning with urination Yes  . Vaginal itching   . Dysuria   . Screen for  STD (sexually transmitted disease)   . Screening for cervical cancer      Plan: Discussed her symptoms.  UA was unremarkable.   She consented to pelvic exam.    Swabs taken, pap and urine culture sent.  Will treat for likely UTI.  Discussed diagnosis, treatment, usual time frame to see improvement.  Discussed preventative measures.    discussed pap smear.      Contraception is Nexplanon placed a year ago.    She voiced understanding and agreement of plan  F/u pending labs  Jamielee was seen today for urinary tract infection.  Diagnoses and all orders for this visit:  Burning with urination -     POCT Urinalysis DIP (Proadvantage Device) -     POCT urine pregnancy  Vaginal itching -     Cytology - PAP -     POCT Wet Prep [facility])  Dysuria -     Urine Culture  Screen for STD (sexually transmitted disease) -     Cytology - PAP -     POCT Wet Prep [facility])  Screening for cervical cancer -     Cytology - PAP -     POCT Wet Prep [facility])  Other orders -     sulfamethoxazole-trimethoprim (BACTRIM DS,SEPTRA DS) 800-160 MG tablet;  Take 1 tablet by mouth 2 (two) times daily.

## 2018-03-01 LAB — URINE CULTURE

## 2018-03-02 LAB — CYTOLOGY - PAP
Chlamydia: NEGATIVE
DIAGNOSIS: NEGATIVE
Neisseria Gonorrhea: NEGATIVE

## 2018-03-07 NOTE — Progress Notes (Signed)
Will refer. 

## 2019-03-16 ENCOUNTER — Other Ambulatory Visit: Payer: Self-pay

## 2019-03-16 ENCOUNTER — Telehealth: Payer: Self-pay | Admitting: *Deleted

## 2019-03-16 ENCOUNTER — Encounter (HOSPITAL_COMMUNITY): Payer: Self-pay

## 2019-03-16 ENCOUNTER — Ambulatory Visit (HOSPITAL_COMMUNITY)
Admission: EM | Admit: 2019-03-16 | Discharge: 2019-03-16 | Disposition: A | Discharge status: Home / Self Care | Payer: Medicaid Other | Attending: Internal Medicine | Admitting: Internal Medicine

## 2019-03-16 DIAGNOSIS — B9789 Other viral agents as the cause of diseases classified elsewhere: Secondary | ICD-10-CM | POA: Diagnosis not present

## 2019-03-16 DIAGNOSIS — J069 Acute upper respiratory infection, unspecified: Secondary | ICD-10-CM

## 2019-03-16 DIAGNOSIS — Z20822 Contact with and (suspected) exposure to covid-19: Secondary | ICD-10-CM

## 2019-03-16 MED ORDER — BENZONATATE 200 MG PO CAPS: 200.0000 mg | ORAL_CAPSULE | Freq: Three times a day (TID) | ORAL | 0 refills | Status: DC | PRN

## 2019-03-16 NOTE — ED Provider Notes (Signed)
[facility]    CSN: [id number] Arrival date & time: 03/16/19  1727     History   Chief Complaint Chief Complaint  Patient presents with  . Cough  . Nasal Congestion    HPI Phyllis Lopez is a 23 y.o. female. Started feeling bad 3d ago, with cough (lots of cough), runny nose, sore throat.  No fever.  Post tussive emesis x 2.  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  No sick contacts at home.  No health conditions.  Takes allergy med (otc pill, doesn't know name).  NKA.    HPI  Past Medical History:  Diagnosis Date  . Healthy adult     Patient Active Problem List   Diagnosis Date Noted  . Status post vaginal delivery 09/22/2015  . Post term pregnancy, 41 weeks 09/20/2015  . Language barrier, speaks Nepali only 06/06/2015    Past Surgical History:  Procedure Laterality Date  . EYE SURGERY  2011   lazy eye    OB History    Gravida  1   Para  1   Term  1   Preterm      AB      Living  1     SAB      TAB      Ectopic      Multiple  0   Live Births  1            Home Medications    Prior to Admission medications   Medication Sig Start Date End Date Taking? Authorizing Provider  benzonatate (TESSALON) 200 MG capsule Take 1 capsule (200 mg total) by mouth 3 (three) times daily as needed for cough. 03/16/19   Isa Rankin, MD    Family History Family History  Problem Relation Age of Onset  . Healthy Mother   . Healthy Father   . Diabetes Neg Hx   . Hypertension Neg Hx     Social History Social History   Tobacco Use  . Smoking status: Never Smoker  . Smokeless tobacco: Never Used  Substance Use Topics  . Alcohol use: No  . Drug use: No     Allergies   Patient has no known allergies.   Review of Systems Review of Systems  All other systems reviewed and are negative.    Physical Exam Triage Vital Signs ED Triage Vitals  Enc Vitals Group     BP 03/16/19 1758 109/69   Pulse Rate 03/16/19 1758 87     Resp 03/16/19 1758 18     Temp 03/16/19 1758 98.4 F (36.9 C)     Temp Source 03/16/19 1758 Oral     SpO2 03/16/19 1758 100 %     Weight --      Height --      Pain Score 03/16/19 1753 7     Pain Loc --    Updated Vital Signs BP 109/69 (BP Location: Left Arm)   Pulse 87   Temp 98.4 F (36.9 C) (Oral)   Resp 18   SpO2 100%  Physical Exam Vitals signs and nursing note reviewed.  Constitutional:      Comments: Alert, nicely groomed Looks ill but not toxic.  Frequent coughing.  Wearing mask.  HENT:     Head: Atraumatic.     Comments: B TMs slightly dull, right is pink flushed Mod nasal congestion with mucusy material present Throat pink    Mouth/Throat:  Mouth: Mucous membranes are moist.  Eyes:     Comments: Conjugate gaze, no eye redness/drainage  Neck:     Musculoskeletal: Neck supple.  Cardiovascular:     Rate and Rhythm: Normal rate and regular rhythm.  Pulmonary:     Effort: No respiratory distress.     Breath sounds: No wheezing or rales.     Comments: Coarse but symmetric breath sounds throughout Frequent coughing Abdominal:     General: There is no distension.  Musculoskeletal: Normal range of motion.        General: No swelling.     Comments: No leg swelling  Skin:    General: Skin is warm and dry.     Comments: No cyanosis  Neurological:     Mental Status: She is alert and oriented to person, place, and time.      Final Clinical Impressions(s) / UC Diagnoses   Final diagnoses:  Viral URI with cough  Suspected Covid-19 Virus Infection     Discharge Instructions     Your symptoms today suggest a viral respiratory infection, including possible covid infection.  I am requesting a covid test for youi, the [facility] call center will contact you by phone to tell you when to go for your test. Test result may take 3-5 days or even a little longer to return.  Rest and push fluids.  Prescription for cough perles (benzonatate)  was sent to the pharmacy.  Ok to take ibuprofen or tylenol as needed for aches or fever.  Anticipate gradual imrpovement in cough and runny nose over the next several days.  Please stay home and avoid close contact with others until test result is known.  Go the emergency room if severe breathlessness occurs.  Recheck or followup with your primary care provider if increasing cough, more phlegm, persistent (>3 days) fever >100, or if not improving as expected.     ED Prescriptions    Medication Sig Dispense Auth. Provider   benzonatate (TESSALON) 200 MG capsule Take 1 capsule (200 mg total) by mouth 3 (three) times daily as needed for cough. 30 capsule Isa RankinMurray, Ikeisha Blumberg Wilson, MD        Isa RankinMurray, Ewan Grau Wilson, MD 03/16/19 [phone]

## 2019-03-16 NOTE — Telephone Encounter (Addendum)
Attempted to contact pt on [phone] using interpreter, Beudha ID [phone] but no answer at this time and unable to leave voicemail message. Pt also contacted at [phone] and message was left for the pt to return call. Pt will need to be scheduled for COVID-19 testing. Ordering provider is Dr. Ria Clock.

## 2019-03-16 NOTE — ED Triage Notes (Signed)
Patient presents to Urgent Care with complaints of cough,intermittent fever, headache, and nasal congestion since a few days ago. Patient reports nobody else in her house is sick, is requesting testing for COVID-19, interpreter used to inform pt that we are not testing at this site.

## 2019-03-16 NOTE — Discharge Instructions (Addendum)
Your symptoms today suggest a viral respiratory infection, including possible covid infection.  I am requesting a covid test for youi, the [facility] call center will contact you by phone to tell you when to go for your test. Test result may take 3-5 days or even a little longer to return.  Rest and push fluids.  Prescription for cough perles (benzonatate) was sent to the pharmacy.  Ok to take ibuprofen or tylenol as needed for aches or fever.  Anticipate gradual imrpovement in cough and runny nose over the next several days.  Please stay home and avoid close contact with others until test result is known.  Go the emergency room if severe breathlessness occurs.  Recheck or followup with your primary care provider if increasing cough, more phlegm, persistent (>3 days) fever >100, or if not improving as expected.

## 2019-03-17 ENCOUNTER — Telehealth: Payer: Self-pay | Admitting: Hematology

## 2019-03-17 DIAGNOSIS — Z20822 Contact with and (suspected) exposure to covid-19: Secondary | ICD-10-CM

## 2019-03-17 NOTE — Telephone Encounter (Signed)
Interpreter ID: [phone] / Unable to leave voicemail at [phone] / Sister in law Phyllis Lopez) answered at alternate [phone] / Sister in law verbalizes understanding. /

## 2019-03-17 NOTE — Telephone Encounter (Signed)
-----   Message from Isa Rankin, MD sent at 03/16/2019  6:47 PM EDT ----- Regarding: FW: covid testing needed Patient speaks nepali Cell # [phone] Alt contact # [phone]  ----- Message ----- From: Isa Rankin, MD Sent: 03/16/2019   6:28 PM EDT To: [facility] Subject: covid testing needed

## 2019-03-19 ENCOUNTER — Other Ambulatory Visit: Payer: Medicaid Other

## 2019-03-19 DIAGNOSIS — Z20822 Contact with and (suspected) exposure to covid-19: Secondary | ICD-10-CM

## 2019-03-20 LAB — NOVEL CORONAVIRUS, NAA: SARS-CoV-2, NAA: NOT DETECTED

## 2019-03-21 ENCOUNTER — Telehealth: Payer: Self-pay | Admitting: Medical

## 2019-03-21 NOTE — Telephone Encounter (Signed)
See if you can get in touch with her.   She went for covid 19 testing and was negative.  That office can't get in touch with her and she doesn't speak english.  We may need to do virtual consult if she has concerns, symptoms.     See if you can get in touch with her and advised of the negative test.

## 2019-03-22 NOTE — Telephone Encounter (Signed)
Patient husband notified of results and that they need to set up a virtual visit or tele visit.   He states they will call back to schedule appointment.

## 2019-03-30 ENCOUNTER — Other Ambulatory Visit: Payer: Self-pay

## 2019-03-30 ENCOUNTER — Encounter: Payer: Self-pay | Admitting: Medical

## 2019-03-30 ENCOUNTER — Ambulatory Visit: Payer: Medicaid Other | Admitting: Medical

## 2019-03-30 VITALS — Ht 63.0 in | Wt 140.0 lb

## 2019-03-30 DIAGNOSIS — R05 Cough: Secondary | ICD-10-CM

## 2019-03-30 DIAGNOSIS — R059 Cough, unspecified: Secondary | ICD-10-CM

## 2019-03-30 NOTE — Progress Notes (Signed)
Subjective: No chief complaint on file.  This visit was set up initially as a follow-up from a COVID testing we had received.  She had went to urgent care on Mar 16, 2019, for viral upper respiratory infection and cough.  She was tested for COVID-19 which came back negative few days later that we received.  When this appointment was set up it was unclear if she was still having symptoms or not.  I spoke to patient on the phone today and she has 0 symptoms and has felt fine for at least a 1.5 weeks.  She has already went back to work and is aware of the negative Covid test  She has no other concerns   Objective: Ht 5\' 3"  (1.6 m)   Wt 140 lb (63.5 kg)   BMI 24.80 kg/m    Assessment: Encounter Diagnosis  Name Primary?  . Cough Yes     Plan: I reviewed her records and she was tested negative on May 29 for COVID, had a viral upper respiratory infection that has completely resolved over a week and a half ago.  She had 0 complaints today.  I treated this as a courtesy call and stay as there was really no significant assessment or eval  Diagnoses and all orders for this visit:  Cough

## 2019-05-08 ENCOUNTER — Other Ambulatory Visit: Payer: Self-pay

## 2019-05-08 DIAGNOSIS — Z20822 Contact with and (suspected) exposure to covid-19: Secondary | ICD-10-CM

## 2019-05-11 LAB — NOVEL CORONAVIRUS, NAA: SARS-CoV-2, NAA: NOT DETECTED

## 2019-08-30 LAB — OB RESULTS CONSOLE GC/CHLAMYDIA
Chlamydia: NEGATIVE
Gonorrhea: NEGATIVE

## 2019-08-30 LAB — OB RESULTS CONSOLE ABO/RH: RH Type: POSITIVE

## 2019-08-30 LAB — OB RESULTS CONSOLE RPR: RPR: NONREACTIVE

## 2019-08-30 LAB — OB RESULTS CONSOLE RUBELLA ANTIBODY, IGM: Rubella: IMMUNE

## 2019-08-30 LAB — OB RESULTS CONSOLE HEPATITIS B SURFACE ANTIGEN: Hepatitis B Surface Ag: NEGATIVE

## 2019-08-30 LAB — OB RESULTS CONSOLE ANTIBODY SCREEN: Antibody Screen: NEGATIVE

## 2019-08-30 LAB — OB RESULTS CONSOLE HIV ANTIBODY (ROUTINE TESTING): HIV: NONREACTIVE

## 2019-09-04 ENCOUNTER — Encounter (HOSPITAL_COMMUNITY): Payer: Self-pay | Admitting: *Deleted

## 2019-09-07 ENCOUNTER — Ambulatory Visit (HOSPITAL_COMMUNITY): Payer: Medicaid Other | Admitting: *Deleted

## 2019-09-07 ENCOUNTER — Encounter (HOSPITAL_COMMUNITY): Payer: Self-pay

## 2019-09-07 ENCOUNTER — Ambulatory Visit (HOSPITAL_COMMUNITY)
Admission: RE | Admit: 2019-09-07 | Discharge: 2019-09-07 | Disposition: A | Discharge status: Home / Self Care | Payer: Medicaid Other | Source: Ambulatory Visit | Attending: Obstetrics and Gynecology | Admitting: Obstetrics and Gynecology

## 2019-09-07 ENCOUNTER — Other Ambulatory Visit: Payer: Self-pay

## 2019-09-07 ENCOUNTER — Ambulatory Visit (HOSPITAL_COMMUNITY): Payer: Medicaid Other

## 2019-09-07 ENCOUNTER — Other Ambulatory Visit (HOSPITAL_COMMUNITY): Payer: Self-pay | Admitting: *Deleted

## 2019-09-07 ENCOUNTER — Other Ambulatory Visit (HOSPITAL_COMMUNITY): Payer: Self-pay | Admitting: Nurse Practitioner

## 2019-09-07 VITALS — BP 106/57 | HR 66 | Temp 97.7°F | Wt 132.2 lb

## 2019-09-07 DIAGNOSIS — Z3687 Encounter for antenatal screening for uncertain dates: Secondary | ICD-10-CM | POA: Diagnosis not present

## 2019-09-07 DIAGNOSIS — Z3A15 15 weeks gestation of pregnancy: Secondary | ICD-10-CM | POA: Insufficient documentation

## 2019-09-07 DIAGNOSIS — Z369 Encounter for antenatal screening, unspecified: Secondary | ICD-10-CM

## 2019-09-07 DIAGNOSIS — O359XX0 Maternal care for (suspected) fetal abnormality and damage, unspecified, not applicable or unspecified: Secondary | ICD-10-CM

## 2019-09-07 DIAGNOSIS — Z363 Encounter for antenatal screening for malformations: Secondary | ICD-10-CM

## 2019-09-07 DIAGNOSIS — Z3682 Encounter for antenatal screening for nuchal translucency: Secondary | ICD-10-CM | POA: Insufficient documentation

## 2019-09-07 DIAGNOSIS — Z362 Encounter for other antenatal screening follow-up: Secondary | ICD-10-CM

## 2019-10-05 ENCOUNTER — Ambulatory Visit (HOSPITAL_COMMUNITY)
Admission: RE | Admit: 2019-10-05 | Discharge: 2019-10-05 | Disposition: A | Discharge status: Home / Self Care | Payer: Medicaid Other | Source: Ambulatory Visit | Attending: Obstetrics and Gynecology | Admitting: Obstetrics and Gynecology

## 2019-10-05 ENCOUNTER — Other Ambulatory Visit (HOSPITAL_COMMUNITY): Payer: Self-pay | Admitting: *Deleted

## 2019-10-05 ENCOUNTER — Other Ambulatory Visit: Payer: Self-pay

## 2019-10-05 DIAGNOSIS — Z3A19 19 weeks gestation of pregnancy: Secondary | ICD-10-CM

## 2019-10-05 DIAGNOSIS — Z362 Encounter for other antenatal screening follow-up: Secondary | ICD-10-CM | POA: Insufficient documentation

## 2019-10-05 DIAGNOSIS — O3503X0 Maternal care for (suspected) central nervous system malformation or damage in fetus, choroid plexus cysts, not applicable or unspecified: Secondary | ICD-10-CM

## 2019-10-05 DIAGNOSIS — O350XX0 Maternal care for (suspected) central nervous system malformation in fetus, not applicable or unspecified: Secondary | ICD-10-CM

## 2019-10-19 NOTE — L&D Delivery Note (Signed)
[facility] Delivery Note  Phyllis Lopez is a 24 y.o. G2P1001 s/p SVD at [redacted]w[redacted]d. She was admitted for active labor.   ROM: 0h 31m with clear fluid, then after delivery she had fresh mec from the uterus GBS Status: neg Maximum Maternal Temperature: 98.2  Labor Progress: Marland Kitchen Phyllis Lopez presented in active labor and progressed to complete spontaneously at [phone] at which time she had SROM during exam with FHR 120s. She began pushing with some effort, but began to have FHR decels to the 80s. IFSE was applied for accuracy and the pt was put in hands and knees. The FHR was determined to be stable enough to continue pushing and she progressed to SVD.  Delivery Date/Time: May 15th, 2021 at 8466 Delivery: Called to room and patient was complete and pushing. Head delivered OA. No nuchal cord present. Shoulder and body delivered in usual fashion. Infant with spontaneous cry, placed on mother's abdomen, dried and stimulated. Cord clamped x 2 after 1-minute delay, and cut by FOB. Cord blood drawn. Placenta delivered spontaneously with gentle cord traction. Fundus firm with massage and Pitocin. Labia, perineum, vagina, and cervix inspected and found to be intact.   Placenta: spont, intact Complications: none Lacerations: none EBL: 50cc Analgesia: none  Postpartum Planning [ ]  message to sent to schedule follow-up- no, pt of [facility]  Infant: female  APGARs 7/9  3250g (7lb 2.6oz)  9/9, CNM  03/01/2020 7:00 AM

## 2019-11-09 ENCOUNTER — Ambulatory Visit (HOSPITAL_COMMUNITY)
Admission: RE | Admit: 2019-11-09 | Discharge: 2019-11-09 | Disposition: A | Discharge status: Home / Self Care | Payer: Medicaid Other | Source: Ambulatory Visit | Attending: Obstetrics | Admitting: Obstetrics

## 2019-11-09 ENCOUNTER — Encounter (HOSPITAL_COMMUNITY): Payer: Self-pay

## 2019-11-09 ENCOUNTER — Ambulatory Visit (HOSPITAL_COMMUNITY): Payer: Medicaid Other | Admitting: *Deleted

## 2019-11-09 ENCOUNTER — Other Ambulatory Visit: Payer: Self-pay

## 2019-11-09 VITALS — BP 104/52 | HR 93 | Temp 97.3°F

## 2019-11-09 DIAGNOSIS — O3503X0 Maternal care for (suspected) central nervous system malformation or damage in fetus, choroid plexus cysts, not applicable or unspecified: Secondary | ICD-10-CM

## 2019-11-09 DIAGNOSIS — O350XX0 Maternal care for (suspected) central nervous system malformation in fetus, not applicable or unspecified: Secondary | ICD-10-CM | POA: Insufficient documentation

## 2019-11-09 DIAGNOSIS — O358XX0 Maternal care for other (suspected) fetal abnormality and damage, not applicable or unspecified: Secondary | ICD-10-CM | POA: Diagnosis not present

## 2019-11-09 DIAGNOSIS — Z362 Encounter for other antenatal screening follow-up: Secondary | ICD-10-CM

## 2019-11-09 DIAGNOSIS — Z3A24 24 weeks gestation of pregnancy: Secondary | ICD-10-CM

## 2020-01-28 LAB — OB RESULTS CONSOLE GBS: GBS: NEGATIVE

## 2020-01-28 LAB — OB RESULTS CONSOLE GC/CHLAMYDIA
Chlamydia: NEGATIVE
Gonorrhea: NEGATIVE

## 2020-02-25 ENCOUNTER — Other Ambulatory Visit: Payer: Self-pay | Admitting: Family Medicine

## 2020-02-25 DIAGNOSIS — O480 Post-term pregnancy: Secondary | ICD-10-CM

## 2020-02-26 ENCOUNTER — Telehealth (HOSPITAL_COMMUNITY): Payer: Self-pay | Admitting: *Deleted

## 2020-02-26 NOTE — Telephone Encounter (Signed)
[phone] interpreter number

## 2020-02-26 NOTE — Telephone Encounter (Signed)
Preadmission screen  

## 2020-02-27 ENCOUNTER — Encounter (HOSPITAL_COMMUNITY): Payer: Self-pay | Admitting: *Deleted

## 2020-02-27 ENCOUNTER — Telehealth (HOSPITAL_COMMUNITY): Payer: Self-pay | Admitting: *Deleted

## 2020-02-27 NOTE — Telephone Encounter (Signed)
Preadmission screen Interpreter number [phone]

## 2020-02-29 ENCOUNTER — Other Ambulatory Visit (HOSPITAL_COMMUNITY)
Admission: RE | Admit: 2020-02-29 | Discharge: 2020-02-29 | Disposition: A | Discharge status: Home / Self Care | Payer: Medicaid Other | Source: Ambulatory Visit | Attending: Family Medicine | Admitting: Family Medicine

## 2020-02-29 LAB — SARS CORONAVIRUS 2 (TAT 6-24 HRS): SARS Coronavirus 2: NEGATIVE

## 2020-03-01 ENCOUNTER — Inpatient Hospital Stay (HOSPITAL_COMMUNITY)
Admission: AD | Admit: 2020-03-01 | Discharge: 2020-03-02 | DRG: 807 | Disposition: A | Discharge status: Home / Self Care | Payer: Medicaid Other | Attending: Obstetrics and Gynecology | Admitting: Obstetrics and Gynecology

## 2020-03-01 ENCOUNTER — Other Ambulatory Visit: Payer: Self-pay

## 2020-03-01 ENCOUNTER — Encounter (HOSPITAL_COMMUNITY): Payer: Self-pay | Admitting: Family Medicine

## 2020-03-01 DIAGNOSIS — Z3A40 40 weeks gestation of pregnancy: Secondary | ICD-10-CM

## 2020-03-01 DIAGNOSIS — O480 Post-term pregnancy: Secondary | ICD-10-CM | POA: Diagnosis present

## 2020-03-01 DIAGNOSIS — Z789 Other specified health status: Secondary | ICD-10-CM | POA: Diagnosis present

## 2020-03-01 DIAGNOSIS — O26893 Other specified pregnancy related conditions, third trimester: Secondary | ICD-10-CM | POA: Diagnosis present

## 2020-03-01 DIAGNOSIS — Z758 Other problems related to medical facilities and other health care: Secondary | ICD-10-CM | POA: Diagnosis present

## 2020-03-01 DIAGNOSIS — Z20822 Contact with and (suspected) exposure to covid-19: Secondary | ICD-10-CM | POA: Diagnosis present

## 2020-03-01 DIAGNOSIS — Z3A41 41 weeks gestation of pregnancy: Secondary | ICD-10-CM | POA: Diagnosis present

## 2020-03-01 LAB — CBC
HCT: 40.9 % (ref 36.0–46.0)
Hemoglobin: 13.8 g/dL (ref 12.0–15.0)
MCH: 30.7 pg (ref 26.0–34.0)
MCHC: 33.7 g/dL (ref 30.0–36.0)
MCV: 90.9 fL (ref 80.0–100.0)
Platelets: 163 10*3/uL (ref 150–400)
RBC: 4.5 MIL/uL (ref 3.87–5.11)
RDW: 13.6 % (ref 11.5–15.5)
WBC: 15 10*3/uL — ABNORMAL HIGH (ref 4.0–10.5)
nRBC: 0 % (ref 0.0–0.2)

## 2020-03-01 LAB — TYPE AND SCREEN
ABO/RH(D): AB POS
Antibody Screen: NEGATIVE

## 2020-03-01 LAB — RPR: RPR Ser Ql: NONREACTIVE

## 2020-03-01 LAB — ABO/RH: ABO/RH(D): AB POS

## 2020-03-01 MED ORDER — COCONUT OIL OIL: 1.0000 "application " | TOPICAL_OIL | Status: DC | PRN

## 2020-03-01 MED ORDER — TETANUS-DIPHTH-ACELL PERTUSSIS 5-2.5-18.5 LF-MCG/0.5 IM SUSP: 0.5000 mL | Freq: Once | INTRAMUSCULAR | Status: DC

## 2020-03-01 MED ORDER — SENNOSIDES-DOCUSATE SODIUM 8.6-50 MG PO TABS
2.0000 | ORAL_TABLET | ORAL | Status: DC
  Administered 2020-03-02: 2 via ORAL
  Filled 2020-03-01: qty 2

## 2020-03-01 MED ORDER — SIMETHICONE 80 MG PO CHEW: 80.0000 mg | CHEWABLE_TABLET | ORAL | Status: DC | PRN

## 2020-03-01 MED ORDER — ACETAMINOPHEN 325 MG PO TABS
650.0000 mg | ORAL_TABLET | ORAL | Status: DC | PRN
  Administered 2020-03-01: 650 mg via ORAL
  Filled 2020-03-01: qty 2

## 2020-03-01 MED ORDER — DIBUCAINE (PERIANAL) 1 % EX OINT: 1.0000 "application " | TOPICAL_OINTMENT | CUTANEOUS | Status: DC | PRN

## 2020-03-01 MED ORDER — OXYCODONE HCL 5 MG PO TABS: 5.0000 mg | ORAL_TABLET | ORAL | Status: DC | PRN

## 2020-03-01 MED ORDER — TERBUTALINE SULFATE 1 MG/ML IJ SOLN: 0.2500 mg | Freq: Once | INTRAMUSCULAR | Status: DC | PRN

## 2020-03-01 MED ORDER — OXYTOCIN BOLUS FROM INFUSION
500.0000 mL | Freq: Once | INTRAVENOUS | Status: AC
  Administered 2020-03-01: 500 mL via INTRAVENOUS

## 2020-03-01 MED ORDER — SOD CITRATE-CITRIC ACID 500-334 MG/5ML PO SOLN: 30.0000 mL | ORAL | Status: DC | PRN

## 2020-03-01 MED ORDER — IBUPROFEN 600 MG PO TABS
600.0000 mg | ORAL_TABLET | Freq: Four times a day (QID) | ORAL | Status: DC
  Administered 2020-03-01 – 2020-03-02 (×7): 600 mg via ORAL
  Filled 2020-03-01 (×7): qty 1

## 2020-03-01 MED ORDER — WITCH HAZEL-GLYCERIN EX PADS: 1.0000 "application " | MEDICATED_PAD | CUTANEOUS | Status: DC | PRN

## 2020-03-01 MED ORDER — ACETAMINOPHEN 325 MG PO TABS: 650.0000 mg | ORAL_TABLET | ORAL | Status: DC | PRN

## 2020-03-01 MED ORDER — BENZOCAINE-MENTHOL 20-0.5 % EX AERO
1.0000 "application " | INHALATION_SPRAY | CUTANEOUS | Status: DC | PRN
  Filled 2020-03-01: qty 56

## 2020-03-01 MED ORDER — ONDANSETRON HCL 4 MG PO TABS: 4.0000 mg | ORAL_TABLET | ORAL | Status: DC | PRN

## 2020-03-01 MED ORDER — OXYTOCIN 40 UNITS IN NORMAL SALINE INFUSION - SIMPLE MED
2.5000 [IU]/h | INTRAVENOUS | Status: DC
  Administered 2020-03-01: 2.5 [IU]/h via INTRAVENOUS
  Filled 2020-03-01 (×2): qty 1000

## 2020-03-01 MED ORDER — ONDANSETRON HCL 4 MG/2ML IJ SOLN: 4.0000 mg | Freq: Four times a day (QID) | INTRAMUSCULAR | Status: DC | PRN

## 2020-03-01 MED ORDER — DIPHENHYDRAMINE HCL 25 MG PO CAPS: 25.0000 mg | ORAL_CAPSULE | Freq: Four times a day (QID) | ORAL | Status: DC | PRN

## 2020-03-01 MED ORDER — LACTATED RINGERS IV SOLN: INTRAVENOUS | Status: DC

## 2020-03-01 MED ORDER — ZOLPIDEM TARTRATE 5 MG PO TABS: 5.0000 mg | ORAL_TABLET | Freq: Every evening | ORAL | Status: DC | PRN

## 2020-03-01 MED ORDER — PRENATAL MULTIVITAMIN CH
1.0000 | ORAL_TABLET | Freq: Every day | ORAL | Status: DC
  Administered 2020-03-01 – 2020-03-02 (×2): 1 via ORAL
  Filled 2020-03-01 (×2): qty 1

## 2020-03-01 MED ORDER — ONDANSETRON HCL 4 MG/2ML IJ SOLN: 4.0000 mg | INTRAMUSCULAR | Status: DC | PRN

## 2020-03-01 MED ORDER — LIDOCAINE HCL (PF) 1 % IJ SOLN: 30.0000 mL | INTRAMUSCULAR | Status: DC | PRN

## 2020-03-01 MED ORDER — MISOPROSTOL 50MCG HALF TABLET: 50.0000 ug | ORAL_TABLET | ORAL | Status: DC | PRN

## 2020-03-01 MED ORDER — LACTATED RINGERS IV SOLN
500.0000 mL | INTRAVENOUS | Status: DC | PRN
  Administered 2020-03-01: 500 mL via INTRAVENOUS

## 2020-03-01 MED ORDER — MEASLES, MUMPS & RUBELLA VAC IJ SOLR: 0.5000 mL | Freq: Once | INTRAMUSCULAR | Status: DC

## 2020-03-01 NOTE — MAU Note (Signed)
Patient reports CTX every 5 minutes.  Denies LOF.  Some bloody show.  + FM.  1 cm last exam.

## 2020-03-01 NOTE — Discharge Instructions (Signed)

## 2020-03-01 NOTE — H&P (Signed)
Phyllis Lopez is a 24 y.o. female G2P1001 @ 40.6wks by 15wk u/s presenting for active labor. Denies leaking or bldg; no H/A, N/V or visual disturbances. Her preg has been followed by the [facility] and has been remarkable for:  # right CP cysts seen on anatomy scan, resolved by 24wk f/u # GBS neg # language barrier (Nepali)  OB History    Gravida  2   Para  1   Term  1   Preterm      AB      Living  1     SAB      TAB      Ectopic      Multiple  0   Live Births  1          Past Medical History:  Diagnosis Date  . Healthy adult   . Medical history non-contributory    Past Surgical History:  Procedure Laterality Date  . EYE SURGERY  2011   lazy eye   Family History: family history includes Healthy in her father and mother. Social History:  reports that she has never smoked. She has never used smokeless tobacco. She reports that she does not drink alcohol or use drugs.     Maternal Diabetes: No Genetic Screening: Declined Maternal Ultrasounds/Referrals: Other:right CP cysts- resolved Fetal Ultrasounds or other Referrals:  Referred to Materal Fetal Medicine  Maternal Substance Abuse:  No Significant Maternal Medications:  None Significant Maternal Lab Results:  Group B Strep negative Other Comments:  None  Review of Systems History Dilation: 8.5 Effacement (%): 80 Station: -1 Exam by:: Camelia Eng, RN Blood pressure 115/73, pulse 87, temperature 98.2 F (36.8 C), temperature source Oral, resp. rate 16, height 5\' 2"  (1.575 m), weight 71.2 kg, last menstrual period 06/04/2019, SpO2 100 %, unknown if currently breastfeeding. Exam Physical Exam  Constitutional: She is oriented to person, place, and time. She appears well-developed.  HENT:  Head: Normocephalic.  Cardiovascular: Normal rate.  Respiratory: Effort normal.  GI:  EFM 135-140, +accels, no decels Ctx q 2-7mins  Musculoskeletal:        General: Normal range of motion.     Cervical back:  Normal range of motion.  Neurological: She is alert and oriented to person, place, and time.  Skin: Skin is warm and dry.  Psychiatric: She has a normal mood and affect. Her behavior is normal. Thought content normal.    Prenatal labs: ABO, Rh: AB pos (09/07/19) Antibody: neg (09/07/19) Rubella: Immune (11/12 0000) RPR: Nonreactive (11/12 0000)  HBsAg: Negative (11/12 0000)  HIV: Non-reactive (11/12 0000)  GBS: Negative/-- (04/12 0000)   Assessment/Plan: IUP@term  Active labor GBS neg  Admit to Labor & Delivery Expectant management Anticipate vag del   01-23-1976 CNM 03/01/2020, 5:34 AM

## 2020-03-01 NOTE — Discharge Summary (Addendum)
Postpartum Discharge Summary  Date of Service updated 03/02/20     Patient Name: Phyllis Lopez DOB: 1996/09/17 MRN: [id number]  Date of admission: 03/01/2020 Delivery date:03/01/2020  Delivering provider: Serita Grammes D  Date of discharge: 03/02/2020  Admitting diagnosis: Labor and delivery, indication for care [O75.9] Intrauterine pregnancy: [redacted]w[redacted]d    Secondary diagnosis:  Active Problems:   Language barrier, speaks Nepali only   Post term pregnancy, 41 weeks  Additional problems: none    Discharge diagnosis: Term Pregnancy Delivered                                              Post partum procedures:none Augmentation: none Complications: None  Hospital course: Onset of Labor With Vaginal Delivery      24y.o. yo G2P1001 at 450w6das admitted in Active Labor on 03/01/2020. Patent arrived to L&D and progressed to SVD within 2hrs.  Membrane Rupture Time/Date: 6:11 AM ,03/01/2020   Delivery Method:Vaginal, Spontaneous  Episiotomy: None  Lacerations:  None  Patient had an uncomplicated postpartum course.  She is ambulating, tolerating a regular diet, passing flatus, and urinating well. Patient is discharged home in stable condition on 03/02/20.  Newborn Data: Birth date:03/01/2020  Birth time:6:38 AM  Gender:Female  Living status:Living  Apgars:7 ,9  Weight: 3250g (7lb 2.6oz)  Magnesium Sulfate received: No BMZ received: No Rhophylac:N/A MMR:N/A T-DaP:unsure Flu: N/A Transfusion:No  Physical exam  Vitals:   03/01/20 1745 03/01/20 2103 03/02/20 0614 03/02/20 1430  BP: (!) 99/58 101/67 102/67 110/76  Pulse: 74 83 81 87  Resp: '18 18 18 18  '$ Temp: 98.3 F (36.8 C) 97.9 F (36.6 C) 97.6 F (36.4 C) 97.9 F (36.6 C)  TempSrc: Oral Axillary Oral   SpO2:    99%  Weight:      Height:       General: alert, cooperative and no distress Lochia: appropriate Uterine Fundus: Firm Incision: N/A DVT Evaluation: No evidence of DVT seen on physical exam. Labs: Lab Results   Component Value Date   WBC 15.0 (H) 03/01/2020   HGB 13.8 03/01/2020   HCT 40.9 03/01/2020   MCV 90.9 03/01/2020   PLT 163 03/01/2020   No flowsheet data found. Edinburgh Score: Edinburgh Postnatal Depression Scale Screening Tool 03/02/2020  I have been able to laugh and see the funny side of things. 0  I have looked forward with enjoyment to things. 0  I have blamed myself unnecessarily when things went wrong. 0  I have been anxious or worried for no good reason. 0  I have felt scared or panicky for no good reason. 0  Things have been getting on top of me. 0  I have been so unhappy that I have had difficulty sleeping. 0  I have felt sad or miserable. 0  I have been so unhappy that I have been crying. 0  The thought of harming myself has occurred to me. 0  Edinburgh Postnatal Depression Scale Total 0     After visit meds:  Allergies as of 03/02/2020   No Known Allergies     Medication List    STOP taking these medications   sulfamethoxazole-trimethoprim 800-160 MG tablet Commonly known as: BACTRIM DS     TAKE these medications   acetaminophen 325 MG tablet Commonly known as: Tylenol Take 2 tablets (650 mg total) by mouth  every 4 (four) hours as needed (for pain scale < 4).   benzonatate 200 MG capsule Commonly known as: TESSALON Take 1 capsule (200 mg total) by mouth 3 (three) times daily as needed for cough.   ibuprofen 200 MG tablet Commonly known as: ADVIL Take 3 tablets (600 mg total) by mouth every 6 (six) hours.   PRENATAL VITAMIN PO Take by mouth.        Discharge home in stable condition Infant Feeding: Bottle and Breast Infant Disposition:home with mother Discharge instruction: per After Visit Summary and Postpartum booklet. Activity: Advance as tolerated. Pelvic rest for 6 weeks.  Diet: routine diet Future Appointments: No future appointments. Follow up Visit: Follow-up Information    Department, [facility]. Schedule an  appointment as soon as possible for a visit in 4 week(s).   Why: for your postpartum appointment Contact information: [place] [address] [phone]           Pt to set up with [facility] for 4wks   03/02/2020 Fatima Blank, CNM

## 2020-03-02 ENCOUNTER — Inpatient Hospital Stay (HOSPITAL_COMMUNITY): Payer: Medicaid Other

## 2020-03-02 ENCOUNTER — Inpatient Hospital Stay (HOSPITAL_COMMUNITY)
Admission: AD | Admit: 2020-03-02 | Payer: Medicaid Other | Source: Home / Self Care | Admitting: Obstetrics & Gynecology

## 2020-03-02 MED ORDER — ACETAMINOPHEN 325 MG PO TABS: 650.0000 mg | ORAL_TABLET | ORAL | Status: AC | PRN

## 2020-03-02 MED ORDER — IBUPROFEN 200 MG PO TABS: 600.0000 mg | ORAL_TABLET | Freq: Four times a day (QID) | ORAL | Status: AC

## 2020-03-02 NOTE — Progress Notes (Signed)
Post Partum Day 1  Subjective: no complaints, up ad lib, voiding, tolerating PO and + flatus  Objective: Blood pressure 102/67, pulse 81, temperature 97.6 F (36.4 C), temperature source Oral, resp. rate 18, height 5\' 2"  (1.575 m), weight 71.2 kg, last menstrual period 06/04/2019, SpO2 100 %, unknown if currently breastfeeding.  Physical Exam:  General: alert, cooperative and no distress Lochia: appropriate Uterine Fundus: firm Incision: n/a DVT Evaluation: No evidence of DVT seen on physical exam.  Recent Labs    03/01/20 0437  HGB 13.8  HCT 40.9    Assessment/Plan: Plan for discharge tomorrow and Contraception Outpatient Nexplanon   LOS: 1 day   03/03/20 03/02/2020, 12:43 PM

## 2020-03-06 ENCOUNTER — Inpatient Hospital Stay (HOSPITAL_COMMUNITY)
Admission: AD | Admit: 2020-03-06 | Discharge: 2020-03-06 | Disposition: A | Discharge status: Home / Self Care | Payer: Medicaid Other | Attending: Obstetrics and Gynecology | Admitting: Obstetrics and Gynecology

## 2020-03-06 ENCOUNTER — Inpatient Hospital Stay (HOSPITAL_COMMUNITY): Payer: Medicaid Other

## 2020-03-06 ENCOUNTER — Telehealth: Payer: Self-pay | Admitting: Medical

## 2020-03-06 ENCOUNTER — Other Ambulatory Visit: Payer: Self-pay

## 2020-03-06 DIAGNOSIS — Z791 Long term (current) use of non-steroidal anti-inflammatories (NSAID): Secondary | ICD-10-CM | POA: Diagnosis not present

## 2020-03-06 DIAGNOSIS — O8612 Endometritis following delivery: Secondary | ICD-10-CM | POA: Diagnosis not present

## 2020-03-06 LAB — CBC
HCT: 40.8 % (ref 36.0–46.0)
Hemoglobin: 13.5 g/dL (ref 12.0–15.0)
MCH: 30.5 pg (ref 26.0–34.0)
MCHC: 33.1 g/dL (ref 30.0–36.0)
MCV: 92.3 fL (ref 80.0–100.0)
Platelets: 193 10*3/uL (ref 150–400)
RBC: 4.42 MIL/uL (ref 3.87–5.11)
RDW: 13.5 % (ref 11.5–15.5)
WBC: 12.5 10*3/uL — ABNORMAL HIGH (ref 4.0–10.5)
nRBC: 0 % (ref 0.0–0.2)

## 2020-03-06 LAB — URINALYSIS, ROUTINE W REFLEX MICROSCOPIC
Bilirubin Urine: NEGATIVE
Glucose, UA: NEGATIVE mg/dL
Ketones, ur: NEGATIVE mg/dL
Nitrite: NEGATIVE
Protein, ur: NEGATIVE mg/dL
RBC / HPF: >50 RBC/hpf — ABNORMAL HIGH (ref 0–5)
Specific Gravity, Urine: 1.005 (ref 1.005–1.030)
pH: 5 (ref 5.0–8.0)

## 2020-03-06 MED ORDER — AMOXICILLIN-POT CLAVULANATE 875-125 MG PO TABS: 1.0000 | ORAL_TABLET | Freq: Two times a day (BID) | ORAL | 0 refills | Status: AC

## 2020-03-06 MED ORDER — MORPHINE SULFATE (PF) 4 MG/ML IV SOLN
2.0000 mg | Freq: Once | INTRAVENOUS | Status: AC
  Administered 2020-03-06: 2 mg via INTRAVENOUS
  Filled 2020-03-06: qty 1

## 2020-03-06 NOTE — Telephone Encounter (Signed)
Please call and congratulate her on the new baby.  I got a note from the hospital advising of the new delivery! 

## 2020-03-06 NOTE — MAU Provider Note (Signed)
History     CSN: [id number]  Arrival date and time: 03/06/20 8182   First Provider Initiated Contact with Patient 03/06/20 [phone]      Chief Complaint  Patient presents with  . Abdominal Pain   23 y.o. G2P2 s/p SVD 5 days ago presenting via EMS with LAP. Reports onset this am. Describes as constant and achy. Rates pain 7/10. Has not taken anything for it. Pain has improved since arrival to MAU. No urinary sx. Had normal BM this am. Elpidio Galea is scant, changes pad TID. She is eating and drinking. Denies fever or chills.   OB History    Gravida  2   Para  2   Term  2   Preterm      AB      Living  2     SAB      TAB      Ectopic      Multiple  0   Live Births  2           Past Medical History:  Diagnosis Date  . Healthy adult   . Medical history non-contributory     Past Surgical History:  Procedure Laterality Date  . EYE SURGERY  2011   lazy eye    Family History  Problem Relation Age of Onset  . Healthy Mother   . Healthy Father   . Diabetes Neg Hx   . Hypertension Neg Hx     Social History   Tobacco Use  . Smoking status: Never Smoker  . Smokeless tobacco: Never Used  Substance Use Topics  . Alcohol use: No  . Drug use: No    Allergies: No Known Allergies  Medications Prior to Admission  Medication Sig Dispense Refill Last Dose  . ibuprofen (ADVIL) 200 MG tablet Take 3 tablets (600 mg total) by mouth every 6 (six) hours.   03/05/2020 at Unknown time  . acetaminophen (TYLENOL) 325 MG tablet Take 2 tablets (650 mg total) by mouth every 4 (four) hours as needed (for pain scale < 4).     . benzonatate (TESSALON) 200 MG capsule Take 1 capsule (200 mg total) by mouth 3 (three) times daily as needed for cough. (Patient not taking: Reported on 03/30/2019) 30 capsule 0   . Prenatal Vit-Fe Fumarate-FA (PRENATAL VITAMIN PO) Take by mouth.       Review of Systems  Constitutional: Negative for chills and fever.  Gastrointestinal: Positive for  abdominal pain. Negative for constipation, diarrhea, nausea and vomiting.  Genitourinary: Positive for vaginal bleeding. Negative for dysuria and frequency.   Physical Exam   Blood pressure (!) 105/57, pulse 90, temperature 98.1 F (36.7 C), resp. rate 16, SpO2 99 %, unknown if currently breastfeeding.  Physical Exam  Nursing note and vitals reviewed. Constitutional: She is oriented to person, place, and time. She appears well-developed and well-nourished. No distress.  HENT:  Head: Normocephalic and atraumatic.  Cardiovascular: Normal rate.  Respiratory: Effort normal. No respiratory distress.  GI: Soft. She exhibits no distension and no mass. There is no abdominal tenderness. There is no rebound and no guarding.  Genitourinary:    Genitourinary Comments: External: no lesions or erythema; malodorous Vagina: rugated, pink, moist, scant thin bloody discharge Uterus: + enlarged, anteverted, ++ tender, + CMT Adnexae: no masses, no tenderness left, no tenderness right     Musculoskeletal:        General: Normal range of motion.     Cervical back: Normal range of motion.  Neurological: She is alert and oriented to person, place, and time.  Skin: Skin is warm and dry.  Psychiatric: She has a normal mood and affect.   Results for orders placed or performed during the hospital encounter of 03/06/20 (from the past 24 hour(s))  Urinalysis, Routine w reflex microscopic     Status: Abnormal   Collection Time: 03/06/20  9:57 AM  Result Value Ref Range   Color, Urine YELLOW YELLOW   APPearance CLEAR CLEAR   Specific Gravity, Urine 1.005 1.005 - 1.030   pH 5.0 5.0 - 8.0   Glucose, UA NEGATIVE NEGATIVE mg/dL   Hgb urine dipstick LARGE (A) NEGATIVE   Bilirubin Urine NEGATIVE NEGATIVE   Ketones, ur NEGATIVE NEGATIVE mg/dL   Protein, ur NEGATIVE NEGATIVE mg/dL   Nitrite NEGATIVE NEGATIVE   Leukocytes,Ua MODERATE (A) NEGATIVE   RBC / HPF >50 (H) 0 - 5 RBC/hpf   WBC, UA 21-50 0 - 5 WBC/hpf    Bacteria, UA RARE (A) NONE SEEN   Squamous Epithelial / LPF 0-5 0 - 5  CBC     Status: Abnormal   Collection Time: 03/06/20 10:49 AM  Result Value Ref Range   WBC 12.5 (H) 4.0 - 10.5 K/uL   RBC 4.42 3.87 - 5.11 MIL/uL   Hemoglobin 13.5 12.0 - 15.0 g/dL   HCT 32.9 19.1 - 66.0 %   MCV 92.3 80.0 - 100.0 fL   MCH 30.5 26.0 - 34.0 pg   MCHC 33.1 30.0 - 36.0 g/dL   RDW 60.0 45.9 - 97.7 %   Platelets 193 150 - 400 K/uL   nRBC 0.0 0.0 - 0.2 %   US Pelvis Complete  Result Date: 03/06/2020 CLINICAL DATA:  Postpartum pain.  Vaginal delivery 03/01/2020. EXAM: TRANSABDOMINAL ULTRASOUND OF PELVIS TECHNIQUE: Transabdominal ultrasound examination of the pelvis was performed including evaluation of the uterus, ovaries, adnexal regions, and pelvic cul-de-sac. COMPARISON:  None. FINDINGS: Uterus Measurements: 17.1 x 8.3 x 11.4 cm = volume: 839 mL. No fibroids or other mass visualized. Endometrium Thickness: 7 mm in thickness. No focal abnormality. No evidence for retained products of conception. Right ovary Measurements: 2.0 x 2.1 x 2.5 cm = volume: 5.7 mL. Normal appearance/no adnexal mass. Left ovary Measurements: 2.8 x 1.9 x 2.0 cm = volume: 5.6 mL. Normal appearance/no adnexal mass. Other findings:  Trace free fluid in the pelvis. IMPRESSION: Postpartum uterus.  No evidence of retained products of conception. Electronically Signed   By: Charlett Nose M.D.   On: 03/06/2020 10:47   MAU Course  Procedures Meds ordered this encounter  Medications  . morphine 4 MG/ML injection 2 mg  . amoxicillin-clavulanate (AUGMENTIN) 875-125 MG tablet    Sig: Take 1 tablet by mouth every 12 (twelve) hours.    Dispense:  28 tablet    Refill:  0    Order Specific Question:   Supervising Provider    Answer:   CONSTANT, PEGGY [[id number]]   MDM Labs and [place] ordered and reviewed. UA with leuks and WBCs, will add UC. Pain improved after meds. No evidence of retained POCs, suspect endometritis, will treat with Augmentin and  analgesia prn. Follow up at [facility] in 1 week- message sent. Stable for discharge home.   Assessment and Plan   1. Postpartum endometritis    Discharge home Follow up at [facility] in 1 week- message sent Rx Augmentin Return precautions  Allergies as of 03/06/2020   No Known Allergies     Medication List  STOP taking these medications   benzonatate 200 MG capsule Commonly known as: TESSALON     TAKE these medications   acetaminophen 325 MG tablet Commonly known as: Tylenol Take 2 tablets (650 mg total) by mouth every 4 (four) hours as needed (for pain scale < 4).   amoxicillin-clavulanate 875-125 MG tablet Commonly known as: AUGMENTIN Take 1 tablet by mouth every 12 (twelve) hours.   ibuprofen 200 MG tablet Commonly known as: ADVIL Take 3 tablets (600 mg total) by mouth every 6 (six) hours.   PRENATAL VITAMIN PO Take by mouth.      Video interpreter used for all encounters  Julianne Handler, [place] 03/06/2020, 11:28 AM

## 2020-03-06 NOTE — Discharge Instructions (Signed)
Endometritis  Endometritis is irritation, soreness, or inflammation that affects the lining of the uterus (endometrium). Infection is usually the cause of endometritis. It is important to get treatment to prevent complications. Common complications may include more severe infections and not being able to have children(infertility). What are the causes? This condition may be caused by:  Bacterial infections.  STIs (sexually transmitted infections).  A miscarriage or childbirth, especially after a long labor or cesarean delivery.  Certain gynecological procedures. These may include dilation and curettage (D&C), hysteroscopy, or birth control (contraceptive) insertion.  Tuberculosis (TB). What are the signs or symptoms? Symptoms of this condition include:  Fever.  Lower abdomen (abdominal) pain.  Pelvis (pelvic) pain.  Abnormal vaginal discharge or bleeding.  Abdominal bloating (distention) or swelling.  General discomfort or generally feeling ill.  Discomfort with bowel movements.  Constipation. How is this diagnosed? This condition may be diagnosed based on:  A physical exam, including a pelvic exam.  Tests, such as: ? Blood tests. ? Removal of a sample of endometrial tissue for testing (endometrial biopsy). ? Examining a sample of vaginal discharge under a microscope (wet prep). ? Removal of a sample of fluid from the cervix for testing (cervical culture). ? Surgical examination of the pelvis and abdomen. How is this treated? This condition is treated with:  Antibiotic medicines.  For more severe cases, hospitalization may be needed to give fluids and antibiotics directly into a vein through an IV tube. Follow these instructions at home:  Take over-the-counter and prescription medicines only as told by your health care provider.  Drink enough fluid to keep your urine clear or pale yellow.  Take your antibiotic medicine as told by your health care provider. Do  not stop taking the antibiotic even if you start to feel better.  Do not douche or have sex (including vaginal, oral, and anal sex) until your health care provider approves.  If your endometritis was caused by an STI, do not have sex (including vaginal, oral, and anal sex) until your partner has also been treated for the STI.  Return to your normal activities as told by your health care provider. Ask your health care provider what activities are safe for you.  Keep all follow-up visits as told by your health care provider. This is important. Contact a health care provider if:  You have pain that does not get better with medicine.  You have a fever.  You have pain with bowel movements. Get help right away if:  You have abdominal swelling.  You have abdominal pain that gets worse.  You have bad-smelling vaginal discharge, or an increased amount of vaginal discharge.  You have abnormal vaginal bleeding.  You have nausea and vomiting. Summary  Endometritis affects the lining of the uterus (endometrium) and is usually caused by an infection.  It is important to get treatment to prevent complications.  You have several treatment options for endometritis. Treatment may include antibiotics and IV fluids.  Take your antibiotic medicine as told by your health care provider. Do not stop taking the antibiotic even if you start to feel better.  Do not douche or have sex (including vaginal, oral, and anal sex) until your health care provider approves. This information is not intended to replace advice given to you by your health care provider. Make sure you discuss any questions you have with your health care provider. Document Revised: 09/16/2017 Document Reviewed: 10/19/2016 Elsevier Patient Education  2020 Elsevier Inc.  

## 2020-03-06 NOTE — MAU Note (Signed)
...  Phyllis Lopez is a 24 y.o. 5 days PP here in MAU reporting that at 0800 she felt the need to have a BM and reports immediately after she had severe pain rating it as a 7. Vaginal delivery on 03/01/20.   Onset of complaint: 03/06/20

## 2020-03-07 LAB — URINE CULTURE: Culture: 10000 — AB

## 2020-03-07 NOTE — Telephone Encounter (Signed)
Patient said thanks

## 2020-04-23 ENCOUNTER — Encounter: Payer: Medicaid Other | Admitting: Obstetrics & Gynecology

## 2020-05-23 ENCOUNTER — Ambulatory Visit (INDEPENDENT_AMBULATORY_CARE_PROVIDER_SITE_OTHER): Payer: Medicaid Other | Admitting: Obstetrics and Gynecology

## 2020-05-23 ENCOUNTER — Other Ambulatory Visit: Payer: Self-pay

## 2020-05-23 ENCOUNTER — Encounter: Payer: Self-pay | Admitting: Obstetrics and Gynecology

## 2020-05-23 VITALS — BP 112/66 | HR 70 | Ht 62.0 in | Wt 159.9 lb

## 2020-05-23 DIAGNOSIS — Z30013 Encounter for initial prescription of injectable contraceptive: Secondary | ICD-10-CM | POA: Diagnosis not present

## 2020-05-23 DIAGNOSIS — Z3202 Encounter for pregnancy test, result negative: Secondary | ICD-10-CM

## 2020-05-23 DIAGNOSIS — Z789 Other specified health status: Secondary | ICD-10-CM

## 2020-05-23 LAB — POCT PREGNANCY, URINE: Preg Test, Ur: NEGATIVE

## 2020-05-23 MED ORDER — MEDROXYPROGESTERONE ACETATE 150 MG/ML IM SUSP
150.0000 mg | Freq: Once | INTRAMUSCULAR | Status: AC
  Administered 2020-05-23: 150 mg via INTRAMUSCULAR

## 2020-05-23 NOTE — Progress Notes (Signed)
Obstetrics and Gynecology Visit Return Patient Evaluation  Appointment Date: 05/23/2020  Primary Care Provider: Jac Canavan  OBGYN Clinic: [facility]  Chief Complaint: birth control counseling  History of Present Illness:  Phyllis Lopez is a 24 y.o. G2P2 with above CC. PMHx significant for PP endometritis about a week after her vaginal delivery in mid may 2021. She had a vag delivery with no tears in a low risk pregnancy. Pap neg 2020. Patient is doing well and not having any problems or issues. Last sex two weeks ago, she has had a period and she is interested in the depo provera shot  Review of Systems: as noted in the History of Present Illness.  Medications: None Allergies: has No Known Allergies.  Physical Exam:  BP 112/66   Pulse 70   Ht 5\' 2"  (1.575 m)   Wt 159 lb 14.4 oz (72.5 kg)   BMI 29.25 kg/m  Body mass index is 29.25 kg/m. General appearance: Well nourished, well developed female in no acute distress.  Neuro/Psych:  Normal mood and affect.    Labs: UPT negative  Assessment: pt doing well  Plan:  1. Encounter for initial prescription of injectable contraceptive D/w her and she would like depo provera. Injection given today and d/w her to wait a week  Interpreter used   RTC: 31m for another depo  1m MD Attending [facility] [employer])

## 2020-05-23 NOTE — Progress Notes (Signed)
Pt wants Depo Shot 

## 2020-05-23 NOTE — Addendum Note (Signed)
Addended by: Henrietta Dine on: 05/23/2020 08:51 AM   Modules accepted: Orders

## 2021-08-13 IMAGING — US US MFM OB FOLLOW-UP
1 series · 13 of 28 positions shown · non-contrast
Comparison: none

[Series 1: us mfm ob follow-up · 13 of 76 slices shown]
[im 3/76]
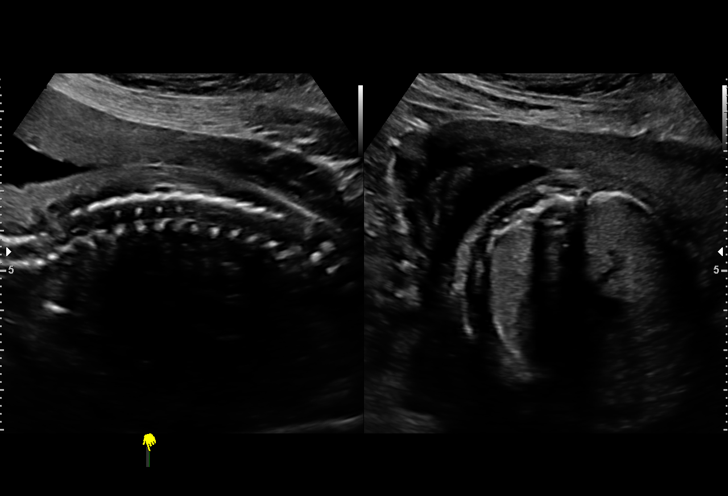
[im 9/76]
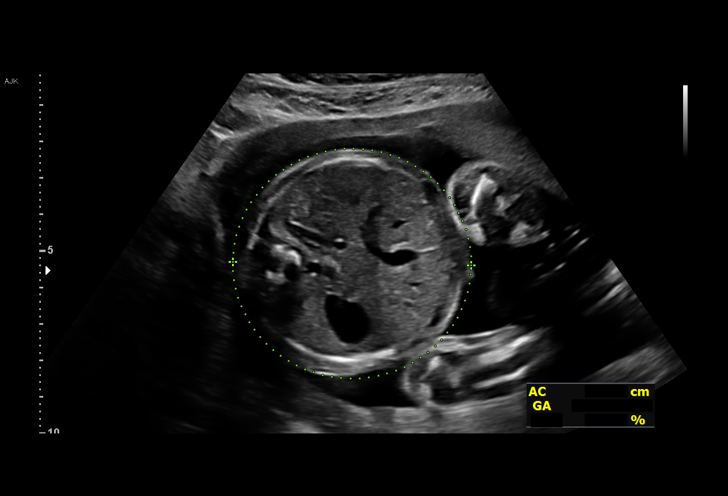
[im 14/76]
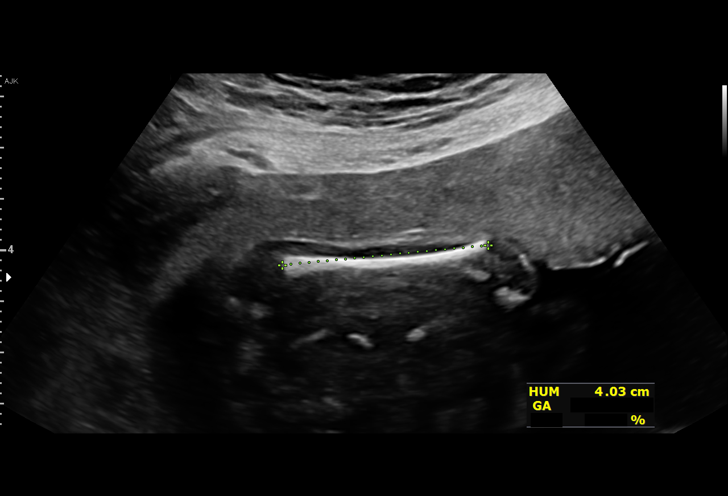
[im 20/76]
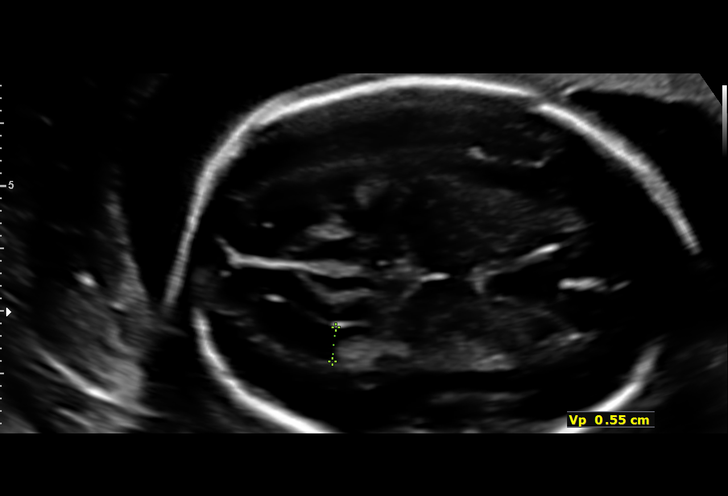
[im 26/76]
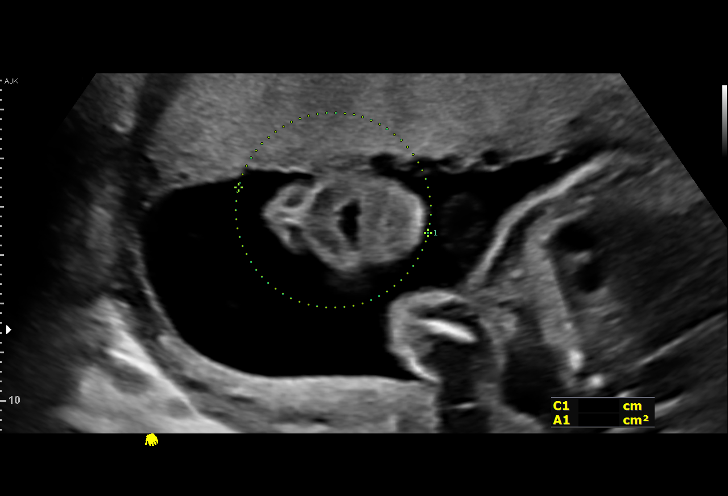
[im 31/76]
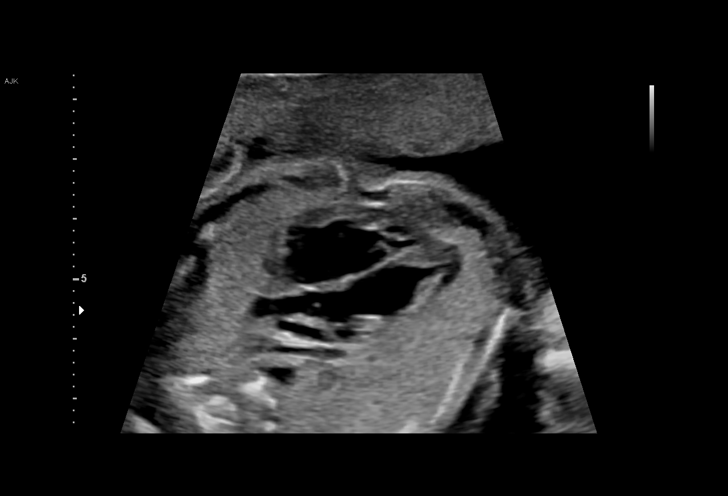
[im 39/76]
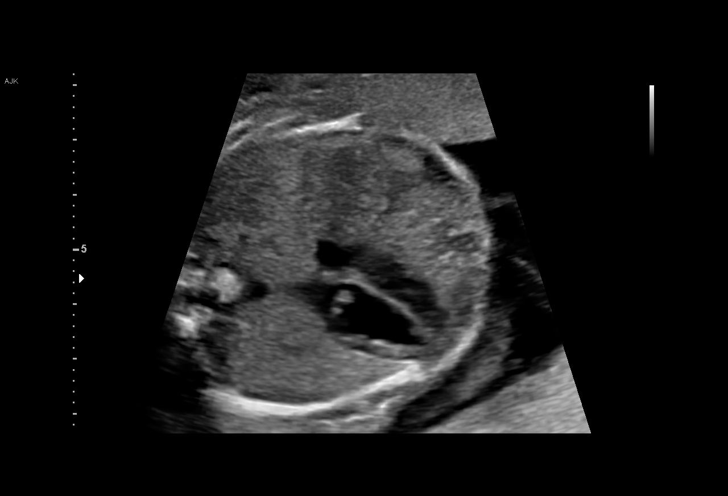
[im 45/76]
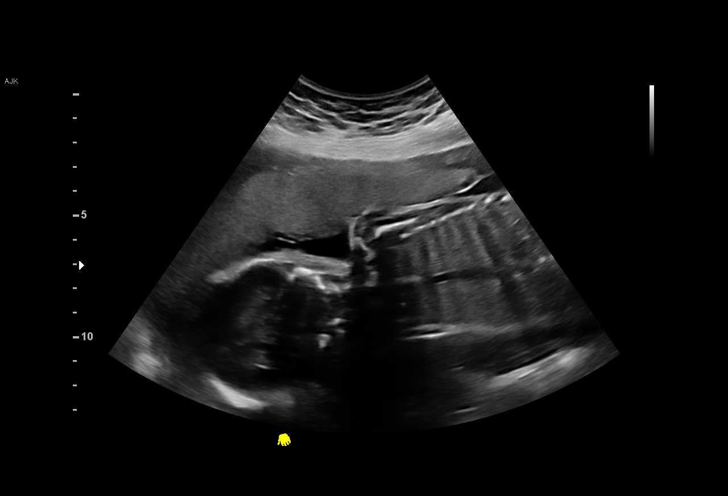
[im 51/76]
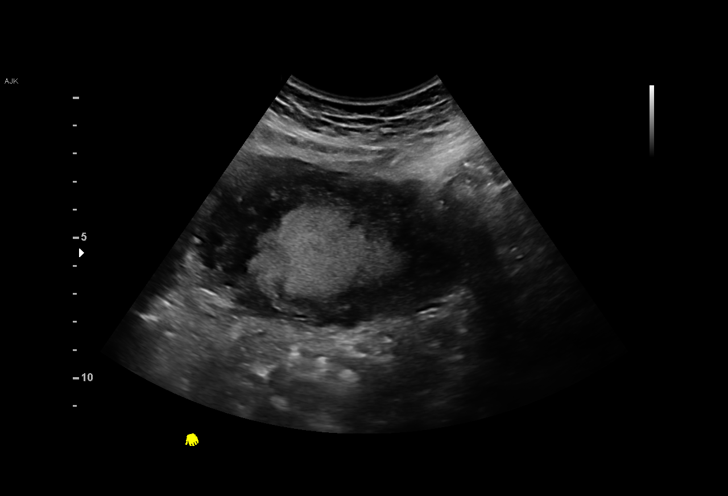
[im 56/76]
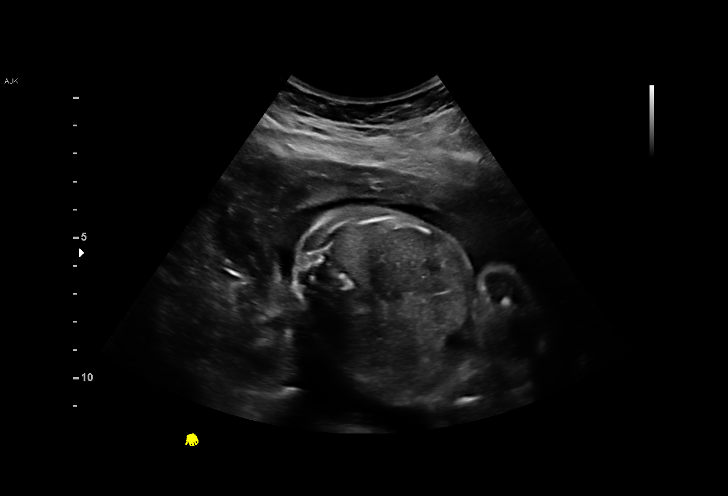
[im 62/76]
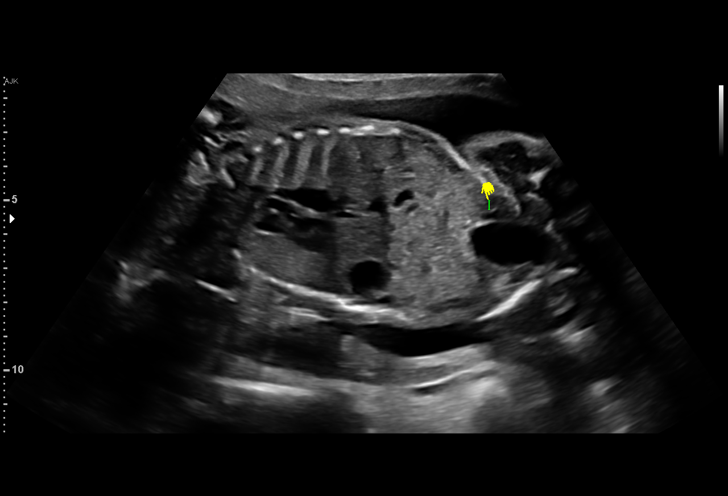
[im 67/76]
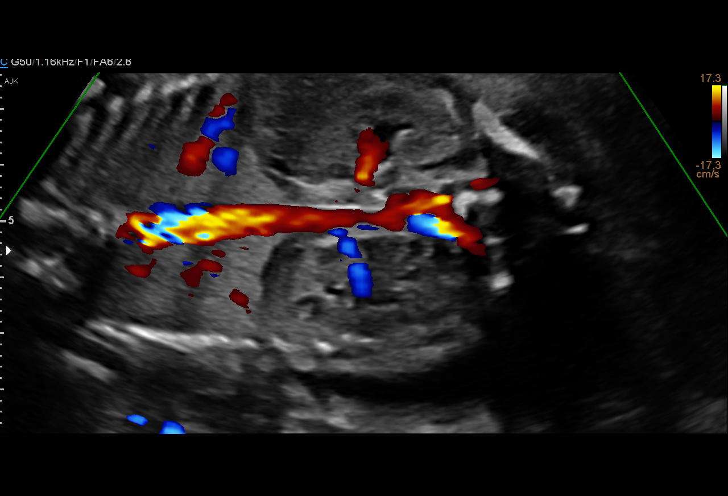
[im 73/76]
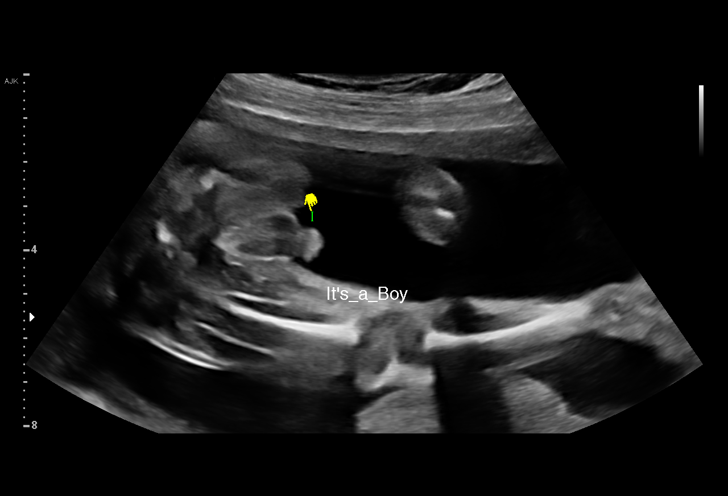

[13 of 28 positions shown; findings below may reference images not displayed]

MATINEZ NP

 ----------------------------------------------------------------------

 ----------------------------------------------------------------------
Indications

  Fetal choroid plexus cyst (resolved)
  Encounter for other antenatal screening
  follow-up
  24 weeks gestation of pregnancy
 ----------------------------------------------------------------------
Vital Signs

                                                Height:        5'3"
Fetal Evaluation

 Num Of Fetuses:         1
 Fetal Heart Rate(bpm):  142
 Cardiac Activity:       Observed
 Presentation:           Breech
 Placenta:               Anterior
 P. Cord Insertion:      Visualized, central

 Amniotic Fluid
 AFI FV:      Within normal limits

                             Largest Pocket(cm)

Biometry

 BPD:      60.2  mm     G. Age:  24w 4d         35  %    CI:        70.44   %    70 - 86
                                                         FL/HC:      18.7   %    18.7 -
 HC:      228.7  mm     G. Age:  24w 6d         36  %    HC/AC:      1.14        1.04 -
 AC:      201.3  mm     G. Age:  24w 5d         42  %    FL/BPD:     70.9   %    71 - 87
 FL:       42.7  mm     G. Age:  24w 0d         16  %    FL/AC:      21.2   %    20 - 24
 HUM:      39.9  mm     G. Age:  24w 2d         33  %
 LV:        5.5  mm

 Est. FW:     699  gm      1 lb 9 oz     29  %
OB History

 Gravidity:    2         Term:   1
 Living:       1
Gestational Age

 LMP:           22w 4d        Date:  06/04/19                 EDD:   03/10/20
 U/S Today:     24w 4d                                        EDD:   02/25/20
 Best:          24w 5d     Det. By:  U/S  (09/07/19)          EDD:   02/24/20
Anatomy

 Cranium:               Appears normal         Aortic Arch:            Appears normal
 Cavum:                 Appears normal         Ductal Arch:            Previously seen
 Ventricles:            Appears normal         Diaphragm:              Appears normal
 Choroid Plexus:        Appears normal         Stomach:                Appears normal, left
                                                                       sided
 Cerebellum:            Appears normal         Abdomen:                Appears normal
 Posterior Fossa:       Appears normal         Abdominal Wall:         Appears nml (cord
                                                                       insert, abd wall)
 Nuchal Fold:           Previously seen        Cord Vessels:           Appears normal (3
                                                                       vessel cord)
 Face:                  Orbits and profile     Kidneys:                Appear normal
                        previously seen
 Lips:                  Appears normal         Bladder:                Appears normal
 Thoracic:              Appears normal         Spine:                  Appears normal
 Heart:                 Appears normal         Upper Extremities:      Previously seen
                        (4CH, axis, and
                        situs)
 RVOT:                  Appears normal         Lower Extremities:      Previously seen
 LVOT:                  Appears normal

 Other:  Male gender. Heels previously seen. Nasal bone previously seen.
Cervix Uterus Adnexa

 Cervix
 Length:           4.25  cm.
 Normal appearance by transabdominal scan.

 Uterus
 No abnormality visualized.

 Left Ovary
 Not visualized.

 Right Ovary
 Not visualized.

 Adnexa
 No abnormality visualized.
Comments

 This patient was seen for a follow up growth scan as a right
 choroid plexus cyst was noted on her last exam.  She denies
 any problems since her last exam.
 She was informed that the fetal growth and amniotic fluid
 level appears appropriate for her gestational age.
 The previously noted choroid plexus cyst was not present on
 today's exam.  The patient was reassured by today's normal
 findings.
 Follow-up as indicated.

## 2022-06-23 ENCOUNTER — Encounter: Payer: Self-pay | Admitting: Internal Medicine

## 2024-01-07 ENCOUNTER — Encounter (HOSPITAL_BASED_OUTPATIENT_CLINIC_OR_DEPARTMENT_OTHER): Payer: Self-pay

## 2024-01-07 ENCOUNTER — Other Ambulatory Visit: Payer: Self-pay

## 2024-01-07 ENCOUNTER — Emergency Department (HOSPITAL_BASED_OUTPATIENT_CLINIC_OR_DEPARTMENT_OTHER)
Admission: EM | Admit: 2024-01-07 | Discharge: 2024-01-07 | Disposition: A | Discharge status: Home / Self Care | Attending: Emergency Medicine | Admitting: Emergency Medicine

## 2024-01-07 DIAGNOSIS — J029 Acute pharyngitis, unspecified: Secondary | ICD-10-CM | POA: Insufficient documentation

## 2024-01-07 LAB — RESP PANEL BY RT-PCR (RSV, FLU A&B, COVID)  RVPGX2
Influenza A by PCR: NEGATIVE
Influenza B by PCR: NEGATIVE
Resp Syncytial Virus by PCR: NEGATIVE
SARS Coronavirus 2 by RT PCR: NEGATIVE

## 2024-01-07 LAB — GROUP A STREP BY PCR: Group A Strep by PCR: NOT DETECTED

## 2024-01-07 LAB — MONONUCLEOSIS SCREEN: Mono Screen: NEGATIVE

## 2024-01-07 LAB — PREGNANCY, URINE: Preg Test, Ur: NEGATIVE

## 2024-01-07 MED ORDER — DEXAMETHASONE SODIUM PHOSPHATE 10 MG/ML IJ SOLN
10.0000 mg | Freq: Once | INTRAMUSCULAR | Status: AC
  Administered 2024-01-07: 10 mg via INTRAMUSCULAR
  Filled 2024-01-07: qty 1

## 2024-01-07 MED ORDER — KETOROLAC TROMETHAMINE 60 MG/2ML IM SOLN
60.0000 mg | Freq: Once | INTRAMUSCULAR | Status: AC
  Administered 2024-01-07: 60 mg via INTRAMUSCULAR
  Filled 2024-01-07: qty 2

## 2024-01-07 MED ORDER — ACETAMINOPHEN 325 MG PO TABS
650.0000 mg | ORAL_TABLET | Freq: Once | ORAL | Status: AC | PRN
  Administered 2024-01-07: 650 mg via ORAL
  Filled 2024-01-07: qty 2

## 2024-01-07 NOTE — ED Provider Notes (Signed)
  [facility] Provider Note   CSN: [id number] Arrival date & time: 01/07/24  2021     History {Add pertinent medical, surgical, social history, OB history to HPI:1} Chief Complaint  Patient presents with   Sore Throat    Phyllis Lopez is a 28 y.o. female.  HPI     Home Medications Prior to Admission medications   Medication Sig Start Date End Date Taking? Authorizing Provider  acetaminophen (TYLENOL) 325 MG tablet Take 2 tablets (650 mg total) by mouth every 4 (four) hours as needed (for pain scale < 4). 03/02/20   Mirian Mo, MD  amoxicillin-clavulanate (AUGMENTIN) 875-125 MG tablet Take 1 tablet by mouth every 12 (twelve) hours. Patient not taking: Reported on 05/23/2020 03/06/20   Donette Larry, CNM  ibuprofen (ADVIL) 200 MG tablet Take 3 tablets (600 mg total) by mouth every 6 (six) hours. 03/02/20   Mirian Mo, MD  Prenatal Vit-Fe Fumarate-FA (PRENATAL VITAMIN PO) Take by mouth. Patient not taking: Reported on 05/23/2020    [provider]      Allergies    Patient has no known allergies.    Review of Systems   Review of Systems  Physical Exam Updated Vital Signs BP 106/67 (BP Location: Right Arm)   Pulse 88   Temp 98.2 F (36.8 C) (Oral)   Resp 16   Ht 5\' 2"  (1.575 m)   Wt 74.4 kg   LMP 11/28/2023 (Within Weeks)   SpO2 98%   BMI 30.00 kg/m  Physical Exam  ED Results / Procedures / Treatments   Labs (all labs ordered are listed, but only abnormal results are displayed) Labs Reviewed  GROUP A STREP BY PCR  RESP PANEL BY RT-PCR (RSV, FLU A&B, COVID)  RVPGX2  MONONUCLEOSIS SCREEN  PREGNANCY, URINE    EKG None  Radiology No results found.  Procedures Procedures  {Document cardiac monitor, telemetry assessment procedure when appropriate:1}  Medications Ordered in ED Medications  acetaminophen (TYLENOL) tablet 650 mg (650 mg Oral Given 01/07/24 2045)  dexamethasone (DECADRON) injection 10 mg  (10 mg Intramuscular Given 01/07/24 2227)  ketorolac (TORADOL) injection 60 mg (60 mg Intramuscular Given 01/07/24 2227)    ED Course/ Medical Decision Making/ A&P   {   Click here for ABCD2, HEART and other calculatorsREFRESH Note before signing :1}                              Medical Decision Making Amount and/or Complexity of Data Reviewed Labs: ordered.  Risk OTC drugs. Prescription drug management.   ***  {Document critical care time when appropriate:1} {Document review of labs and clinical decision tools ie heart score, Chads2Vasc2 etc:1}  {Document your independent review of radiology images, and any outside records:1} {Document your discussion with family members, caretakers, and with consultants:1} {Document social determinants of health affecting pt's care:1} {Document your decision making why or why not admission, treatments were needed:1} Final Clinical Impression(s) / ED Diagnoses Final diagnoses:  None    Rx / DC Orders ED Discharge Orders     None

## 2024-01-07 NOTE — ED Triage Notes (Signed)
 Sore throat and pain while swallowing x Tuesday. Patient seen at [facility] and given Amoxicillin. +Fever. States pain is not improving.
